# Patient Record
Sex: Male | Born: 1970 | Race: White | Hispanic: No | Marital: Single | Smoking: Former smoker
Health system: Southern US, Community
[De-identification: ages and names within clinical notes are randomized; demographics above are authoritative.]

## PROBLEM LIST (undated history)

## (undated) DIAGNOSIS — F10929 Alcohol use, unspecified with intoxication, unspecified: Secondary | ICD-10-CM

## (undated) DIAGNOSIS — E538 Deficiency of other specified B group vitamins: Secondary | ICD-10-CM

## (undated) DIAGNOSIS — I10 Essential (primary) hypertension: Secondary | ICD-10-CM

## (undated) DIAGNOSIS — F1011 Alcohol abuse, in remission: Secondary | ICD-10-CM

## (undated) DIAGNOSIS — F419 Anxiety disorder, unspecified: Secondary | ICD-10-CM

## (undated) DIAGNOSIS — N200 Calculus of kidney: Secondary | ICD-10-CM

## (undated) DIAGNOSIS — R278 Other lack of coordination: Secondary | ICD-10-CM

## (undated) DIAGNOSIS — J45909 Unspecified asthma, uncomplicated: Secondary | ICD-10-CM

## (undated) DIAGNOSIS — N289 Disorder of kidney and ureter, unspecified: Secondary | ICD-10-CM

## (undated) DIAGNOSIS — G4700 Insomnia, unspecified: Secondary | ICD-10-CM

## (undated) HISTORY — PX: APPENDECTOMY: SHX54

## (undated) HISTORY — PX: ABDOMINAL SURGERY: SHX537

## (undated) HISTORY — PX: HERNIA REPAIR: SHX51

---

## 2019-12-08 DIAGNOSIS — F10231 Alcohol dependence with withdrawal delirium: Secondary | ICD-10-CM

## 2019-12-08 DIAGNOSIS — K7010 Alcoholic hepatitis without ascites: Secondary | ICD-10-CM | POA: Insufficient documentation

## 2019-12-08 DIAGNOSIS — R569 Unspecified convulsions: Secondary | ICD-10-CM

## 2019-12-13 DIAGNOSIS — I639 Cerebral infarction, unspecified: Secondary | ICD-10-CM | POA: Insufficient documentation

## 2020-11-20 ENCOUNTER — Other Ambulatory Visit: Payer: Self-pay

## 2020-11-20 ENCOUNTER — Emergency Department: Payer: 59

## 2020-11-20 DIAGNOSIS — I10 Essential (primary) hypertension: Secondary | ICD-10-CM | POA: Diagnosis not present

## 2020-11-20 DIAGNOSIS — K2921 Alcoholic gastritis with bleeding: Secondary | ICD-10-CM | POA: Insufficient documentation

## 2020-11-20 DIAGNOSIS — F10239 Alcohol dependence with withdrawal, unspecified: Secondary | ICD-10-CM | POA: Insufficient documentation

## 2020-11-20 DIAGNOSIS — R1084 Generalized abdominal pain: Secondary | ICD-10-CM | POA: Diagnosis present

## 2020-11-20 DIAGNOSIS — F17210 Nicotine dependence, cigarettes, uncomplicated: Secondary | ICD-10-CM | POA: Insufficient documentation

## 2020-11-20 DIAGNOSIS — J45909 Unspecified asthma, uncomplicated: Secondary | ICD-10-CM | POA: Diagnosis not present

## 2020-11-20 LAB — URINALYSIS, COMPLETE (UACMP) WITH MICROSCOPIC
Bacteria, UA: NONE SEEN
Bilirubin Urine: NEGATIVE
Glucose, UA: NEGATIVE mg/dL
Ketones, ur: NEGATIVE mg/dL
Leukocytes,Ua: NEGATIVE
Nitrite: NEGATIVE
Protein, ur: 100 mg/dL — AB
Specific Gravity, Urine: 1.011 (ref 1.005–1.030)
Squamous Epithelial / HPF: NONE SEEN (ref 0–5)
pH: 6 (ref 5.0–8.0)

## 2020-11-20 LAB — CBC
HCT: 46.5 % (ref 39.0–52.0)
Hemoglobin: 15.7 g/dL (ref 13.0–17.0)
MCH: 31.5 pg (ref 26.0–34.0)
MCHC: 33.8 g/dL (ref 30.0–36.0)
MCV: 93.4 fL (ref 80.0–100.0)
Platelets: 165 10*3/uL (ref 150–400)
RBC: 4.98 MIL/uL (ref 4.22–5.81)
RDW: 14.4 % (ref 11.5–15.5)
WBC: 5.8 10*3/uL (ref 4.0–10.5)
nRBC: 0 % (ref 0.0–0.2)

## 2020-11-20 LAB — COMPREHENSIVE METABOLIC PANEL
ALT: 36 U/L (ref 0–44)
AST: 39 U/L (ref 15–41)
Albumin: 4.3 g/dL (ref 3.5–5.0)
Alkaline Phosphatase: 63 U/L (ref 38–126)
Anion gap: 15 (ref 5–15)
BUN: 10 mg/dL (ref 6–20)
CO2: 23 mmol/L (ref 22–32)
Calcium: 8.5 mg/dL — ABNORMAL LOW (ref 8.9–10.3)
Chloride: 103 mmol/L (ref 98–111)
Creatinine, Ser: 0.82 mg/dL (ref 0.61–1.24)
GFR, Estimated: >60 mL/min (ref >60)
Glucose, Bld: 98 mg/dL (ref 70–99)
Potassium: 3.6 mmol/L (ref 3.5–5.1)
Sodium: 141 mmol/L (ref 135–145)
Total Bilirubin: 0.7 mg/dL (ref 0.3–1.2)
Total Protein: 7.5 g/dL (ref 6.5–8.1)

## 2020-11-20 LAB — LIPASE, BLOOD: Lipase: 25 U/L (ref 11–51)

## 2020-11-20 MED ORDER — SODIUM CHLORIDE 0.9 % IV BOLUS
1000.0000 mL | Freq: Once | INTRAVENOUS | Status: AC
  Administered 2020-11-20: 1000 mL via INTRAVENOUS

## 2020-11-20 MED ORDER — IOHEXOL 300 MG/ML  SOLN
100.0000 mL | Freq: Once | INTRAMUSCULAR | Status: AC | PRN
  Administered 2020-11-20: 100 mL via INTRAVENOUS

## 2020-11-20 MED ORDER — ONDANSETRON HCL 4 MG/2ML IJ SOLN: 4.0000 mg | Freq: Once | INTRAMUSCULAR | Status: DC

## 2020-11-20 MED ORDER — ONDANSETRON HCL 4 MG/2ML IJ SOLN
4.0000 mg | Freq: Once | INTRAMUSCULAR | Status: AC | PRN
  Administered 2020-11-20: 4 mg via INTRAVENOUS
  Filled 2020-11-20: qty 2

## 2020-11-20 MED ORDER — FENTANYL CITRATE (PF) 100 MCG/2ML IJ SOLN
50.0000 ug | Freq: Once | INTRAMUSCULAR | Status: AC
  Administered 2020-11-20: 50 ug via INTRAVENOUS
  Filled 2020-11-20: qty 2

## 2020-11-20 NOTE — ED Triage Notes (Signed)
First Nurse: patient brought in by ems from a hotel. Patient with complaint of near syncope, abdominal pain and vomiting. Patient was given 4 mg IV Zofran by ems. Patient reported to ems that he has been sober for awhile but drank 3 beers today. Ems vital signs bp 190/120 and hr 108.

## 2020-11-20 NOTE — ED Triage Notes (Signed)
L sided abd pain onset today. Hx of bowel obstruction and states pain feels the same. Hx of open appendectomy and diaphragm repair as well. Presents with n/v onset today as well. Reports last bm 2-3 days ago and reports normal in nature. Reports decreased oral intake due to n/v. Reports "a couple" alcoholic beverages today but denies hx of abuse or pancreatitis.

## 2020-11-21 ENCOUNTER — Other Ambulatory Visit: Payer: Self-pay

## 2020-11-21 ENCOUNTER — Emergency Department
Admission: EM | Admit: 2020-11-21 | Discharge: 2020-11-21 | Disposition: A | Discharge status: Home / Self Care | Payer: 59 | Attending: Emergency Medicine | Admitting: Emergency Medicine

## 2020-11-21 ENCOUNTER — Encounter: Payer: Self-pay | Admitting: Emergency Medicine

## 2020-11-21 DIAGNOSIS — F10230 Alcohol dependence with withdrawal, uncomplicated: Secondary | ICD-10-CM

## 2020-11-21 DIAGNOSIS — K2920 Alcoholic gastritis without bleeding: Secondary | ICD-10-CM

## 2020-11-21 DIAGNOSIS — F10930 Alcohol use, unspecified with withdrawal, uncomplicated: Secondary | ICD-10-CM

## 2020-11-21 HISTORY — DX: Alcohol abuse, in remission: F10.11

## 2020-11-21 HISTORY — DX: Unspecified asthma, uncomplicated: J45.909

## 2020-11-21 HISTORY — DX: Essential (primary) hypertension: I10

## 2020-11-21 MED ORDER — LORAZEPAM 1 MG PO TABS: 1.0000 mg | ORAL_TABLET | Freq: Once | ORAL | Status: DC

## 2020-11-21 MED ORDER — CHLORDIAZEPOXIDE HCL 5 MG PO CAPS: ORAL_CAPSULE | ORAL | 0 refills | Status: DC

## 2020-11-21 MED ORDER — ONDANSETRON 4 MG PO TBDP: ORAL_TABLET | ORAL | 0 refills | Status: DC

## 2020-11-21 MED ORDER — LORAZEPAM 2 MG PO TABS
2.0000 mg | ORAL_TABLET | Freq: Once | ORAL | Status: AC
  Administered 2020-11-21: 2 mg via ORAL
  Filled 2020-11-21: qty 1

## 2020-11-21 MED ORDER — LORAZEPAM 2 MG/ML IJ SOLN: 2.0000 mg | Freq: Once | INTRAMUSCULAR | Status: DC

## 2020-11-21 MED ORDER — LACTATED RINGERS IV BOLUS
1000.0000 mL | Freq: Once | INTRAVENOUS | Status: AC
  Administered 2020-11-21: 1000 mL via INTRAVENOUS

## 2020-11-21 MED ORDER — LORAZEPAM 2 MG/ML IJ SOLN
1.0000 mg | Freq: Once | INTRAMUSCULAR | Status: AC
  Administered 2020-11-21: 1 mg via INTRAVENOUS
  Filled 2020-11-21: qty 1

## 2020-11-21 NOTE — ED Notes (Signed)
Pt woke from sleep in subwait, after waking began to shake arms, pt then ambulatory to room 16

## 2020-11-21 NOTE — ED Provider Notes (Signed)
[facility] Emergency Department Provider Note  ____________________________________________   Event Date/Time   First MD Initiated Contact with Patient 11/21/20 0050     (approximate)  I have reviewed the triage vital signs and the nursing notes.   HISTORY  Chief Complaint Abdominal Pain    HPI Isaac Jackson is a 50 y.o. male reports medical history as listed below  which notably includes a history of alcoholism but he says he has never had any complications from alcoholism such as pancreatitis.  He reports that he has had a history of bowel obstruction in the past.  He presents tonight for not feeling well for about 3 days including general malaise, generalized abdominal pain worsening of the top of his abdomen, nausea, and vomiting.  He has had decreased oral intake.  He said the symptoms started shortly after having a few drinks about 4 days ago.  He denies having any alcohol since then.  His symptoms are severe nothing particular makes them better or worse.  He has had kidney stones in the past but says this does not feel like kidney stones.  He has had no dysuria nor hematuria.  No pain in his flank or back.  He denies fever/chills, sore throat, chest pain, shortness of breath.        Past Medical History:  Diagnosis Date  . Asthma   . History of alcohol abuse    patient reports that he is an alcoholic and goes to AA  . Hypertension     There are no problems to display for this patient.   Past Surgical History:  Procedure Laterality Date  . ABDOMINAL SURGERY    . APPENDECTOMY      Prior to Admission medications   Medication Sig Start Date End Date Taking? Authorizing Provider  chlordiazePOXIDE (LIBRIUM) 5 MG capsule Take 6 caps (30 mg) 4x daily on day 1. Take 5 caps (25 mg) 4x daily on day 2. Take 4 caps (20 mg) 4x daily on day 3. Take 3 caps (15 mg) 4x daily on day 4. Take 2 caps (10 mg) 4x daily on day 5. Take 2 caps (10 mg) 3x daily on  day 6. Take 1 cap (5 mg) 3x daily on day 7. Take 1 cap (5 mg) twice daily on day 8. Take 1 cap (5 mg) in the evening on day 9. 11/21/20  Yes Loleta Rose, MD  ondansetron (ZOFRAN ODT) 4 MG disintegrating tablet Allow 1-2 tablets to dissolve in your mouth every 8 hours as needed for nausea/vomiting 11/21/20  Yes Loleta Rose, MD    Allergies Patient has no allergy information on record.  History reviewed. No pertinent family history.  Social History Social History   Tobacco Use  . Smoking status: Current Some Day Smoker    Types: Cigarettes  . Smokeless tobacco: Never Used  Vaping Use  . Vaping Use: Never used  Substance Use Topics  . Alcohol use: Yes    Comment: daily  . Drug use: Never    Review of Systems Constitutional: No fever/chills Eyes: No visual changes. ENT: No sore throat. Cardiovascular: Denies chest pain. Respiratory: Denies shortness of breath. Gastrointestinal: Generalized abdominal pain with nausea and vomiting. Genitourinary: Negative for dysuria.  Denies hematuria. Musculoskeletal: Negative for neck pain.  Negative for back pain. Integumentary: Negative for rash. Neurological: Negative for headaches, focal weakness or numbness.   ____________________________________________   PHYSICAL EXAM:  VITAL SIGNS: ED Triage Vitals  Enc Vitals Group  BP 11/20/20 2020 (S) (!) 166/119     Pulse Rate 11/20/20 2020 (!) 106     Resp 11/20/20 2020 (!) 22     Temp 11/20/20 2203 98 F (36.7 C)     Temp Source 11/20/20 2020 Oral     SpO2 11/20/20 2020 97 %     Weight 11/20/20 2021 63.5 kg (140 lb)     Height 11/20/20 2021 1.702 m (5\' 7" )     Head Circumference --      Peak Flow --      Pain Score 11/20/20 2021 6     Pain Loc --      Pain Edu? --      Excl. in GC? --     Constitutional: Alert and oriented.  Appears uncomfortable but nontoxic. Eyes: Conjunctivae are normal.  Head: Atraumatic. Nose: No congestion/rhinnorhea. Mouth/Throat: Patient is  wearing a mask. Neck: No stridor.  No meningeal signs.   Cardiovascular: Mild tachycardia, regular rhythm. Good peripheral circulation. Respiratory: Normal respiratory effort.  No retractions. Gastrointestinal: Soft and nondistended.  Mild generalized tenderness to palpation, better with distraction. Musculoskeletal: No lower extremity tenderness nor edema. No gross deformities of extremities. Neurologic:  Normal speech and language. No gross focal neurologic deficits are appreciated.  Skin:  Skin is warm, dry and intact. Psychiatric: Mood and affect are normal. Speech and behavior are normal.  ____________________________________________   LABS (all labs ordered are listed, but only abnormal results are displayed)  Labs Reviewed  COMPREHENSIVE METABOLIC PANEL - Abnormal; Notable for the following components:      Result Value   Calcium 8.5 (*)    All other components within normal limits  URINALYSIS, COMPLETE (UACMP) WITH MICROSCOPIC - Abnormal; Notable for the following components:   Color, Urine YELLOW (*)    APPearance CLEAR (*)    Hgb urine dipstick SMALL (*)    Protein, ur 100 (*)    All other components within normal limits  LIPASE, BLOOD  CBC  ETHANOL   ____________________________________________  EKG  ED ECG REPORT I, Loleta Rose, the attending physician, personally viewed and interpreted this ECG.  Date: 11/21/2020 EKG Time: 1:55 AM Rate: 102 Rhythm: Sinus tachycardia QRS Axis: normal Intervals: Incomplete right bundle branch block ST/T Wave abnormalities: Non-specific ST segment / T-wave changes, but no clear evidence of acute ischemia. Narrative Interpretation: no definitive evidence of acute ischemia; does not meet STEMI criteria.   ____________________________________________  RADIOLOGY I, Loleta Rose, personally viewed and evaluated these images (plain radiographs) as part of my medical decision making, as well as reviewing the written report by the  radiologist.  ED MD interpretation: Fatty liver disease with bilateral nonobstructing renal calculi but no evidence of an acute or emergent condition.  Official radiology report(s): CT ABDOMEN PELVIS W CONTRAST  Result Date: 11/20/2020 CLINICAL DATA:  Nausea and vomiting with abdominal pain EXAM: CT ABDOMEN AND PELVIS WITH CONTRAST TECHNIQUE: Multidetector CT imaging of the abdomen and pelvis was performed using the standard protocol following bolus administration of intravenous contrast. CONTRAST:  OMNIPAQUE IOHEXOL 300 MG/ML  SOLN COMPARISON:  None. FINDINGS: Lower chest: No acute abnormality. Hepatobiliary: Fatty infiltration of the liver is noted. The gallbladder is within normal limits. Pancreas: Pancreas is atrophic. Spleen: Normal in size without focal abnormality. Adrenals/Urinary Tract: Adrenal glands are within normal limits. Kidneys demonstrate a normal enhancement pattern bilaterally. Bilateral nonobstructing renal calculi are noted. The largest of these in the left kidney is in the upper pole measuring 9 to  10 mm in greatest dimension. The largest of these on the right measures approximately 6 mm. Ureters are within normal limits. No obstructive changes are seen. The bladder is partially distended. Stomach/Bowel: The appendix is not visualized consistent with a prior surgical history. No obstructive or inflammatory changes of the colon are seen. The small bowel and stomach are within normal limits with the exception of changes of mild hiatal hernia and apparent fundoplication. Vascular/Lymphatic: No significant vascular findings are present. No enlarged abdominal or pelvic lymph nodes. Reproductive: Prostate is unremarkable. Other: No abdominal wall hernia or abnormality. No abdominopelvic ascites. Postsurgical changes are noted consistent with prior hernia repair bilaterally. Musculoskeletal: Degenerative changes of the lumbar spine are noted. IMPRESSION: Fatty liver. Bilateral  nonobstructing renal calculi. Small hiatal hernia and changes of prior fundoplication. Electronically Signed   By: Alcide Clever M.D.   On: 11/20/2020 22:03    ____________________________________________   PROCEDURES   Procedure(s) performed (including Critical Care):  .1-3 Lead EKG Interpretation Performed by: Loleta Rose, MD Authorized by: Loleta Rose, MD     Interpretation: abnormal     ECG rate:  102   ECG rate assessment: tachycardic     Rhythm: sinus tachycardia     Ectopy: none     Conduction: normal       ____________________________________________   INITIAL IMPRESSION / MDM / ASSESSMENT AND PLAN / ED COURSE  As part of my medical decision making, I reviewed the following data within the electronic MEDICAL RECORD NUMBER Nursing notes reviewed and incorporated, Labs reviewed , EKG interpreted , Old chart reviewed, Notes from prior ED visits and [place] Controlled Substance Database   Differential diagnosis includes, but is not limited to, alcoholic gastritis, pancreatitis, SBO/ileus, ureteral colic.  The patient is on the cardiac monitor to evaluate for evidence of arrhythmia and/or significant heart rate changes.  Vital signs are notable for hypertension and tachycardia but I suspect that is related to his current symptoms.  Lab work is generally reassuring with a normal comprehensive metabolic panel, normal CBC, and a small amount of hematuria which could be related to the kidney stones but I suspect that those are not the cause of his current pain since they are intrarenal and not in the ureter.  I suspect he is either having some alcohol withdrawal or has acute alcoholic gastritis or possibly degree of chronic pancreatitis although he denies ever having had pancreatitis in the past.  I cannot find anything on him in the computer system but registration will update his information.  However at this point there is no evidence of an emergent medical condition that would  require him to be hospitalized.  He already received 1 L of crystalloid and I ordered another liter of lactated Ringer's.  We are going to get an EKG so I can verify his intervals and then I will give him a dose of droperidol or possibly Ativan given the concern for withdrawal.  Anticipate discharge and outpatient follow-up.  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  He has access to additional medical care and follow-up if he needs it.  Clinical Course as of 11/21/20 7510  Wynelle Link Nov 21, 2020  2585 Patient feels much better after Ativan.  His heart rate has come down below 100.  He is no longer nauseous or vomiting.  We talked little bit more about the amount  of alcohol he has been drinking and he admitted that he has been drinking more than he initially indicated.  I believe that he has accommodation of alcoholic gastritis and alcohol withdrawal.  I am giving him 1 mg of Ativan by mouth before discharge and he agrees that discharge is appropriate.  I am going to give him the prescription for Librium and I stressed that he should not drink with the Librium.  I gave him information about outpatient resources for alcohol abuse.  I gave my usual and customary return precautions. [CF]  0321 Now that his demographics are complete, I also see that he has had numerous prescriptions for controlled substances including pain medication and Librium over the course of the last 2 years. [CF]  [phone] Of note, even though he does seem to suffer from an alcohol problem, I have no reason to believe that he is working well impaired or that he presents a danger to his patients. 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 However, any reporting of his condition would be a violation of his privacy and I have no right or means by which to do that given that he does  not meet any criteria for involuntary commitment, inpatient medical treatment (no DTs, no seizures), and I have no evidence of him endangering anyone or acting in an irresponsible manner while at work. [CF]    Clinical Course User Index [CF] Loleta Rose, MD     ____________________________________________  FINAL CLINICAL IMPRESSION(S) / ED DIAGNOSES  Final diagnoses:  Acute alcoholic gastritis, presence of bleeding unspecified  Alcohol withdrawal syndrome without complication (HCC)     MEDICATIONS GIVEN DURING THIS VISIT:  Medications  LORazepam (ATIVAN) tablet 2 mg (has no administration in time range)  ondansetron (ZOFRAN) injection 4 mg (4 mg Intravenous Given 11/20/20 2032)  fentaNYL (SUBLIMAZE) injection 50 mcg (50 mcg Intravenous Given 11/20/20 2123)  sodium chloride 0.9 % bolus 1,000 mL (0 mLs Intravenous Stopped 11/20/20 2244)  iohexol (OMNIPAQUE) 300 MG/ML solution 100 mL (100 mLs Intravenous Contrast Given 11/20/20 2148)  lactated ringers bolus 1,000 mL (1,000 mLs Intravenous New Bag/Given 11/21/20 0157)  LORazepam (ATIVAN) injection 1 mg (1 mg Intravenous Given 11/21/20 0158)     ED Discharge Orders         Ordered    chlordiazePOXIDE (LIBRIUM) 5 MG capsule        11/21/20 0322    ondansetron (ZOFRAN ODT) 4 MG disintegrating tablet        11/21/20 3244          *Please note:  Isaac Jackson was evaluated in Emergency Department on 11/21/2020 for the symptoms described in the history of present illness. He was evaluated in the context of the global COVID-19 pandemic, which necessitated consideration that the patient might be at risk for infection with the SARS-CoV-2 virus that causes COVID-19. Institutional protocols and algorithms that pertain to the evaluation of patients at risk for COVID-19 are in a state of rapid change based on information released by regulatory bodies including the CDC and federal and state organizations. These policies and algorithms were  followed during the patient's care in the ED.  Some ED evaluations and interventions may be delayed as a result of limited staffing during and after the pandemic.*  Note:  This document was prepared using Dragon voice recognition software and may include unintentional dictation errors.   Loleta Rose, MD  11/21/20 0338  

## 2020-11-21 NOTE — ED Notes (Signed)
Peripheral IV discontinued. Catheter intact. No signs of infiltration or redness. Gauze applied to IV site.   Discharge instructions reviewed with patient. Questions fielded by this RN. Patient verbalizes understanding of instructions. Patient discharged home in stable condition per forbach. No acute distress noted at time of discharge.   Pt ambulatory to lobby with resources to get ride home

## 2020-11-21 NOTE — ED Notes (Signed)
ED Provider at bedside.  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

## 2020-11-21 NOTE — Discharge Instructions (Signed)
As we discussed, we believe you are having a combination of alcoholic gastritis which is making said your stomach but also alcohol withdrawal.  Please avoid alcohol and use the prescription provided for Librium as written on the label instructions.  Consider following up with 1 or more of the outpatient resource resources presented in the guide.  Return to the emergency department with new or worsening symptoms that concern you.

## 2021-02-21 ENCOUNTER — Other Ambulatory Visit: Payer: Self-pay

## 2021-02-21 ENCOUNTER — Emergency Department: Payer: 59

## 2021-02-21 ENCOUNTER — Inpatient Hospital Stay
Admission: EM | Admit: 2021-02-21 | Discharge: 2021-02-23 | DRG: 694 | Disposition: A | Discharge status: Home / Self Care | Payer: 59 | Attending: Internal Medicine | Admitting: Internal Medicine

## 2021-02-21 ENCOUNTER — Encounter: Payer: Self-pay | Admitting: Emergency Medicine

## 2021-02-21 DIAGNOSIS — Z5320 Procedure and treatment not carried out because of patient's decision for unspecified reasons: Secondary | ICD-10-CM | POA: Diagnosis not present

## 2021-02-21 DIAGNOSIS — R10A0 Flank pain, unspecified side: Secondary | ICD-10-CM

## 2021-02-21 DIAGNOSIS — R17 Unspecified jaundice: Secondary | ICD-10-CM | POA: Diagnosis present

## 2021-02-21 DIAGNOSIS — R52 Pain, unspecified: Secondary | ICD-10-CM

## 2021-02-21 DIAGNOSIS — I10 Essential (primary) hypertension: Secondary | ICD-10-CM | POA: Diagnosis present

## 2021-02-21 DIAGNOSIS — K760 Fatty (change of) liver, not elsewhere classified: Secondary | ICD-10-CM | POA: Diagnosis present

## 2021-02-21 DIAGNOSIS — R251 Tremor, unspecified: Secondary | ICD-10-CM | POA: Diagnosis not present

## 2021-02-21 DIAGNOSIS — R109 Unspecified abdominal pain: Secondary | ICD-10-CM

## 2021-02-21 DIAGNOSIS — K5900 Constipation, unspecified: Secondary | ICD-10-CM | POA: Diagnosis present

## 2021-02-21 DIAGNOSIS — N179 Acute kidney failure, unspecified: Secondary | ICD-10-CM

## 2021-02-21 DIAGNOSIS — E860 Dehydration: Secondary | ICD-10-CM | POA: Diagnosis present

## 2021-02-21 DIAGNOSIS — F17210 Nicotine dependence, cigarettes, uncomplicated: Secondary | ICD-10-CM | POA: Diagnosis present

## 2021-02-21 DIAGNOSIS — E871 Hypo-osmolality and hyponatremia: Secondary | ICD-10-CM | POA: Diagnosis present

## 2021-02-21 DIAGNOSIS — R079 Chest pain, unspecified: Secondary | ICD-10-CM | POA: Diagnosis present

## 2021-02-21 DIAGNOSIS — Z20822 Contact with and (suspected) exposure to covid-19: Secondary | ICD-10-CM | POA: Diagnosis present

## 2021-02-21 DIAGNOSIS — E876 Hypokalemia: Secondary | ICD-10-CM | POA: Diagnosis present

## 2021-02-21 DIAGNOSIS — Z87442 Personal history of urinary calculi: Secondary | ICD-10-CM

## 2021-02-21 DIAGNOSIS — R0789 Other chest pain: Secondary | ICD-10-CM | POA: Diagnosis present

## 2021-02-21 DIAGNOSIS — F419 Anxiety disorder, unspecified: Secondary | ICD-10-CM | POA: Diagnosis present

## 2021-02-21 DIAGNOSIS — N400 Enlarged prostate without lower urinary tract symptoms: Secondary | ICD-10-CM | POA: Diagnosis present

## 2021-02-21 DIAGNOSIS — R112 Nausea with vomiting, unspecified: Secondary | ICD-10-CM | POA: Diagnosis present

## 2021-02-21 DIAGNOSIS — Z7141 Alcohol abuse counseling and surveillance of alcoholic: Secondary | ICD-10-CM

## 2021-02-21 DIAGNOSIS — E861 Hypovolemia: Secondary | ICD-10-CM | POA: Diagnosis present

## 2021-02-21 DIAGNOSIS — F1010 Alcohol abuse, uncomplicated: Secondary | ICD-10-CM | POA: Diagnosis present

## 2021-02-21 DIAGNOSIS — N200 Calculus of kidney: Principal | ICD-10-CM

## 2021-02-21 HISTORY — DX: Calculus of kidney: N20.0

## 2021-02-21 HISTORY — DX: Disorder of kidney and ureter, unspecified: N28.9

## 2021-02-21 LAB — URINALYSIS, COMPLETE (UACMP) WITH MICROSCOPIC
Bilirubin Urine: NEGATIVE
Glucose, UA: NEGATIVE mg/dL
Ketones, ur: NEGATIVE mg/dL
Leukocytes,Ua: NEGATIVE
Nitrite: NEGATIVE
Protein, ur: NEGATIVE mg/dL
Specific Gravity, Urine: 1.005 (ref 1.005–1.030)
Squamous Epithelial / HPF: NONE SEEN (ref 0–5)
pH: 7 (ref 5.0–8.0)

## 2021-02-21 LAB — COMPREHENSIVE METABOLIC PANEL
ALT: 41 U/L (ref 0–44)
AST: 40 U/L (ref 15–41)
Albumin: 4.4 g/dL (ref 3.5–5.0)
Alkaline Phosphatase: 56 U/L (ref 38–126)
Anion gap: 16 — ABNORMAL HIGH (ref 5–15)
BUN: 23 mg/dL — ABNORMAL HIGH (ref 6–20)
CO2: 23 mmol/L (ref 22–32)
Calcium: 9.1 mg/dL (ref 8.9–10.3)
Chloride: 93 mmol/L — ABNORMAL LOW (ref 98–111)
Creatinine, Ser: 1.47 mg/dL — ABNORMAL HIGH (ref 0.61–1.24)
GFR, Estimated: 58 mL/min — ABNORMAL LOW (ref >60)
Glucose, Bld: 108 mg/dL — ABNORMAL HIGH (ref 70–99)
Potassium: 3.1 mmol/L — ABNORMAL LOW (ref 3.5–5.1)
Sodium: 132 mmol/L — ABNORMAL LOW (ref 135–145)
Total Bilirubin: 1.6 mg/dL — ABNORMAL HIGH (ref 0.3–1.2)
Total Protein: 7.9 g/dL (ref 6.5–8.1)

## 2021-02-21 LAB — TROPONIN I (HIGH SENSITIVITY): Troponin I (High Sensitivity): 9 ng/L (ref <18)

## 2021-02-21 LAB — CBC
HCT: 48 % (ref 39.0–52.0)
Hemoglobin: 17 g/dL (ref 13.0–17.0)
MCH: 31.7 pg (ref 26.0–34.0)
MCHC: 35.4 g/dL (ref 30.0–36.0)
MCV: 89.6 fL (ref 80.0–100.0)
Platelets: 223 10*3/uL (ref 150–400)
RBC: 5.36 MIL/uL (ref 4.22–5.81)
RDW: 13.9 % (ref 11.5–15.5)
WBC: 9.8 10*3/uL (ref 4.0–10.5)
nRBC: 0 % (ref 0.0–0.2)

## 2021-02-21 LAB — LIPASE, BLOOD: Lipase: 36 U/L (ref 11–51)

## 2021-02-21 MED ORDER — NALOXONE HCL 0.4 MG/ML IJ SOLN: 0.4000 mg | INTRAMUSCULAR | Status: DC | PRN

## 2021-02-21 MED ORDER — ADULT MULTIVITAMIN W/MINERALS CH
1.0000 | ORAL_TABLET | Freq: Every day | ORAL | Status: DC
  Administered 2021-02-21 – 2021-02-22 (×2): 1 via ORAL
  Filled 2021-02-21 (×2): qty 1

## 2021-02-21 MED ORDER — POTASSIUM CHLORIDE 2 MEQ/ML IV SOLN
INTRAVENOUS | Status: DC
  Filled 2021-02-21 (×8): qty 1000

## 2021-02-21 MED ORDER — LORAZEPAM 1 MG PO TABS: 1.0000 mg | ORAL_TABLET | ORAL | Status: DC | PRN

## 2021-02-21 MED ORDER — ACETAMINOPHEN 325 MG PO TABS: 650.0000 mg | ORAL_TABLET | Freq: Four times a day (QID) | ORAL | Status: DC | PRN

## 2021-02-21 MED ORDER — LORAZEPAM 2 MG/ML IJ SOLN
0.5000 mg | Freq: Four times a day (QID) | INTRAMUSCULAR | Status: DC | PRN
  Administered 2021-02-21: 0.5 mg via INTRAVENOUS
  Filled 2021-02-21: qty 1

## 2021-02-21 MED ORDER — ALPRAZOLAM 0.5 MG PO TABS
1.0000 mg | ORAL_TABLET | Freq: Every day | ORAL | Status: DC | PRN
  Administered 2021-02-21: 1 mg via ORAL
  Filled 2021-02-21: qty 2

## 2021-02-21 MED ORDER — LORAZEPAM 2 MG/ML IJ SOLN: 0.5000 mg | Freq: Once | INTRAMUSCULAR | Status: DC

## 2021-02-21 MED ORDER — POLYETHYLENE GLYCOL 3350 17 G PO PACK
17.0000 g | PACK | Freq: Every day | ORAL | Status: DC
  Administered 2021-02-21 – 2021-02-22 (×2): 17 g via ORAL
  Filled 2021-02-21 (×2): qty 1

## 2021-02-21 MED ORDER — FOLIC ACID 1 MG PO TABS
1.0000 mg | ORAL_TABLET | Freq: Every day | ORAL | Status: DC
  Administered 2021-02-21 – 2021-02-22 (×2): 1 mg via ORAL
  Filled 2021-02-21 (×2): qty 1

## 2021-02-21 MED ORDER — ONDANSETRON HCL 4 MG/2ML IJ SOLN
4.0000 mg | Freq: Four times a day (QID) | INTRAMUSCULAR | Status: DC | PRN
  Administered 2021-02-21 – 2021-02-22 (×3): 4 mg via INTRAVENOUS
  Filled 2021-02-21 (×3): qty 2

## 2021-02-21 MED ORDER — HYDROMORPHONE HCL 1 MG/ML IJ SOLN
1.0000 mg | INTRAMUSCULAR | Status: DC | PRN
Start: 2021-02-21 — End: 2021-02-23
  Administered 2021-02-21 – 2021-02-22 (×3): 1 mg via INTRAVENOUS
  Filled 2021-02-21 (×3): qty 1

## 2021-02-21 MED ORDER — OXYCODONE HCL 5 MG PO TABS
10.0000 mg | ORAL_TABLET | ORAL | Status: DC | PRN
  Administered 2021-02-21 – 2021-02-23 (×5): 10 mg via ORAL
  Filled 2021-02-21 (×5): qty 2

## 2021-02-21 MED ORDER — ONDANSETRON HCL 4 MG/2ML IJ SOLN
4.0000 mg | Freq: Once | INTRAMUSCULAR | Status: AC
  Administered 2021-02-21: 4 mg via INTRAVENOUS
  Filled 2021-02-21: qty 2

## 2021-02-21 MED ORDER — KETOROLAC TROMETHAMINE 30 MG/ML IJ SOLN
30.0000 mg | Freq: Once | INTRAMUSCULAR | Status: AC
  Administered 2021-02-21: 30 mg via INTRAVENOUS
  Filled 2021-02-21: qty 1

## 2021-02-21 MED ORDER — TAMSULOSIN HCL 0.4 MG PO CAPS
0.4000 mg | ORAL_CAPSULE | Freq: Every day | ORAL | Status: DC
  Administered 2021-02-21 – 2021-02-23 (×3): 0.4 mg via ORAL
  Filled 2021-02-21 (×3): qty 1

## 2021-02-21 MED ORDER — THIAMINE HCL 100 MG PO TABS
100.0000 mg | ORAL_TABLET | Freq: Every day | ORAL | Status: DC
  Administered 2021-02-21 – 2021-02-22 (×2): 100 mg via ORAL
  Filled 2021-02-21 (×2): qty 1

## 2021-02-21 MED ORDER — THIAMINE HCL 100 MG/ML IJ SOLN
100.0000 mg | Freq: Every day | INTRAMUSCULAR | Status: DC
  Administered 2021-02-21: 100 mg via INTRAVENOUS
  Filled 2021-02-21: qty 2

## 2021-02-21 MED ORDER — LORAZEPAM 2 MG/ML IJ SOLN: 1.0000 mg | INTRAMUSCULAR | Status: DC | PRN

## 2021-02-21 MED ORDER — MORPHINE SULFATE (PF) 4 MG/ML IV SOLN
4.0000 mg | Freq: Once | INTRAVENOUS | Status: AC
Start: 2021-02-21 — End: 2021-02-21
  Administered 2021-02-21: 4 mg via INTRAVENOUS
  Filled 2021-02-21: qty 1

## 2021-02-21 MED ORDER — HYDROXYZINE PAMOATE 25 MG PO CAPS
25.0000 mg | ORAL_CAPSULE | Freq: Three times a day (TID) | ORAL | Status: DC | PRN
  Filled 2021-02-21 (×2): qty 1

## 2021-02-21 MED ORDER — KCL-LACTATED RINGERS 20 MEQ/L IV SOLN: INTRAVENOUS | Status: DC

## 2021-02-21 MED ORDER — MORPHINE SULFATE (PF) 4 MG/ML IV SOLN
4.0000 mg | Freq: Once | INTRAVENOUS | Status: AC
  Administered 2021-02-21: 4 mg via INTRAVENOUS
  Filled 2021-02-21: qty 1

## 2021-02-21 MED ORDER — HYDROMORPHONE HCL 1 MG/ML IJ SOLN
1.0000 mg | Freq: Once | INTRAMUSCULAR | Status: AC
Start: 2021-02-21 — End: 2021-02-21
  Administered 2021-02-21: 1 mg via INTRAVENOUS
  Filled 2021-02-21: qty 1

## 2021-02-21 MED ORDER — SODIUM CHLORIDE 0.9 % IV SOLN
1000.0000 mL | Freq: Once | INTRAVENOUS | Status: AC
  Administered 2021-02-21: 1000 mL via INTRAVENOUS

## 2021-02-21 MED ORDER — HYDROMORPHONE HCL 1 MG/ML IJ SOLN: 1.0000 mg | INTRAMUSCULAR | Status: DC | PRN

## 2021-02-21 MED ORDER — OXYCODONE HCL 5 MG PO TABS: 5.0000 mg | ORAL_TABLET | ORAL | Status: DC | PRN

## 2021-02-21 MED ORDER — ONDANSETRON HCL 4 MG/2ML IJ SOLN: 4.0000 mg | Freq: Once | INTRAMUSCULAR | Status: DC

## 2021-02-21 MED ORDER — SENNOSIDES-DOCUSATE SODIUM 8.6-50 MG PO TABS
2.0000 | ORAL_TABLET | Freq: Two times a day (BID) | ORAL | Status: DC
  Administered 2021-02-21 – 2021-02-23 (×3): 2 via ORAL
  Filled 2021-02-21 (×3): qty 2

## 2021-02-21 NOTE — ED Triage Notes (Signed)
Says abd pain left lower quad and groin for few days.  Also has some dull chest pain that started yesterday. Last bm several days.  Has hisory of bowel obstruction/hernias with repair.  He is alert and oriented.  Appears in distress. Skin warm and dry.

## 2021-02-21 NOTE — ED Provider Notes (Signed)
[facility] Emergency Department Provider Note   ____________________________________________    I have reviewed the triage vital signs and the nursing notes.   HISTORY  Chief Complaint Abdominal Pain and Chest Pain     HPI Isaac Jackson is a 50 y.o. male with a history as noted below who presents with complaints of left-sided flank pain.  Patient reports pain is been severe today, the past 2 days he has felt "off ".  He reports a history of kidney stones but this feels somewhat different.  He does note a history of hernia repairs as well but denies bulging in his groin or scrotum.  Past Medical History:  Diagnosis Date  . Asthma   . History of alcohol abuse    patient reports that he is an alcoholic and goes to AA  . Hypertension   . Kidney stone   . Renal disorder     Patient Active Problem List   Diagnosis Date Noted  . Kidney stones 02/21/2021    Past Surgical History:  Procedure Laterality Date  . ABDOMINAL SURGERY    . APPENDECTOMY    . HERNIA REPAIR      Prior to Admission medications   Medication Sig Start Date End Date Taking? Authorizing Provider  chlordiazePOXIDE (LIBRIUM) 5 MG capsule Take 6 caps (30 mg) 4x daily on day 1. Take 5 caps (25 mg) 4x daily on day 2. Take 4 caps (20 mg) 4x daily on day 3. Take 3 caps (15 mg) 4x daily on day 4. Take 2 caps (10 mg) 4x daily on day 5. Take 2 caps (10 mg) 3x daily on day 6. Take 1 cap (5 mg) 3x daily on day 7. Take 1 cap (5 mg) twice daily on day 8. Take 1 cap (5 mg) in the evening on day 9. 11/21/20   Loleta Rose, MD  ondansetron (ZOFRAN ODT) 4 MG disintegrating tablet Allow 1-2 tablets to dissolve in your mouth every 8 hours as needed for nausea/vomiting 11/21/20   Loleta Rose, MD     Allergies Patient has no known allergies.  No family history on file.  Social History Social History   Tobacco Use  . Smoking status: Current Some Day Smoker    Types: Cigarettes  .  Smokeless tobacco: Never Used  Vaping Use  . Vaping Use: Never used  Substance Use Topics  . Alcohol use: Yes    Comment: daily  . Drug use: Never    Review of Systems  Constitutional: No fever/chills Eyes: No visual changes.  ENT: No sore throat. Cardiovascular: Denies chest pain. Respiratory: Denies shortness of breath. Gastrointestinal: As above Genitourinary: Negative for dysuria.  No hematuria Musculoskeletal: Negative for back pain. Skin: Negative for rash. Neurological: Negative for headaches or weakness   ____________________________________________   PHYSICAL EXAM:  VITAL SIGNS: ED Triage Vitals  Enc Vitals Group     BP 02/21/21 1341 117/81     Pulse Rate 02/21/21 1341 (!) 102     Resp 02/21/21 1341 16     Temp 02/21/21 1341 97.6 F (36.4 C)     Temp src --      SpO2 02/21/21 1341 100 %     Weight 02/21/21 1341 63.5 kg (140 lb)     Height 02/21/21 1341 1.702 m (5\' 7" )     Head Circumference --      Peak Flow --      Pain Score 02/21/21 1340 6  Pain Loc --      Pain Edu? --      Excl. in GC? --     Constitutional: Alert and oriented. Eyes: Conjunctivae are normal.   Nose: No congestion/rhinnorhea. Mouth/Throat: Mucous membranes are moist.    Cardiovascular: Normal rate, regular rhythm. Grossly normal heart sounds.  Good peripheral circulation. Respiratory: Normal respiratory effort.  No retractions. Lungs CTAB. Gastrointestinal: Soft and nontender. No distention.  No CVA tenderness. Genitourinary: No palpable hernia, no bulging or tenderness to palpation, normal scrotum Musculoskeletal: No lower extremity tenderness nor edema.  Warm and well perfused Neurologic:  Normal speech and language. No gross focal neurologic deficits are appreciated.  Skin:  Skin is warm, dry and intact. No rash noted. Psychiatric: Mood and affect are normal. Speech and behavior are normal.  ____________________________________________   LABS (all labs ordered are  listed, but only abnormal results are displayed)  Labs Reviewed  COMPREHENSIVE METABOLIC PANEL - Abnormal; Notable for the following components:      Result Value   Sodium 132 (*)    Potassium 3.1 (*)    Chloride 93 (*)    Glucose, Bld 108 (*)    BUN 23 (*)    Creatinine, Ser 1.47 (*)    Total Bilirubin 1.6 (*)    GFR, Estimated 58 (*)    Anion gap 16 (*)    All other components within normal limits  URINALYSIS, COMPLETE (UACMP) WITH MICROSCOPIC - Abnormal; Notable for the following components:   Color, Urine YELLOW (*)    APPearance CLEAR (*)    Hgb urine dipstick MODERATE (*)    Bacteria, UA RARE (*)    All other components within normal limits  SARS CORONAVIRUS 2 (TAT 6-24 HRS)  LIPASE, BLOOD  CBC  COMPREHENSIVE METABOLIC PANEL  CBC  MAGNESIUM  PHOSPHORUS   ____________________________________________  EKG  ED ECG REPORT I, Jene Every, the attending physician, personally viewed and interpreted this ECG.  Date: 02/21/2021  Rhythm: Sinus tachycardia QRS Axis: normal Intervals: normal ST/T Wave abnormalities: normal Narrative Interpretation: no evidence of acute ischemia  ____________________________________________  RADIOLOGY  CT renal stone study ____________________________________________   PROCEDURES  Procedure(s) performed: No  Procedures   Critical Care performed: No ____________________________________________   INITIAL IMPRESSION / ASSESSMENT AND PLAN / ED COURSE  Pertinent labs & imaging results that were available during my care of the patient were reviewed by me and considered in my medical decision making (see chart for details).  Patient presents with primary complaint of left flank pain with some radiation to the groin.  Highly suspicious for ureterolithiasis especially given his history.  We will treat with IV Toradol, obtain CT renal stone study  Lab work is consistent with dehydration, IV fluids ordered  Patient still with  pain after Toradol, additional dose of morphine given  ----------------------------------------- 7:03 PM on 02/21/2021 -----------------------------------------  CT scan is reassuring, patient still having pain, will order IV Dilaudid, have discussed with the hospitalist for admission for further evaluation    ____________________________________________   FINAL CLINICAL IMPRESSION(S) / ED DIAGNOSES  Final diagnoses:  Flank pain  Intractable pain        Note:  This document was prepared using Dragon voice recognition software and may include unintentional dictation errors.   Jene Every, MD 02/21/21 [phone]

## 2021-02-21 NOTE — H&P (Addendum)
History and Physical  Isaac Jackson MVH:[id number] DOB: Jan 15, 1971 DOA: 02/21/2021  Referring physician: Dr. 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  Chief Complaint: Severe left flank pain radiating to his left groin.  HPI: Isaac Jackson is a 50 y.o. male with medical history significant for kidney stones first diagnosed at the age of 21, previous bowel obstruction, hernia repair, BPH, essential hypertension, chronic anxiety, who presented to [facility] ED due to worsening left flank pain, radiating into his left groin.  Reports he has had intermittent nausea with vomiting in the last 3 days.  Poor fluid intake.  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  Also reports chest pressure earlier which has now resolved.  Work-up in the ED revealed bilateral nonobstructing kidney stones and AKI.  Given IV opiates without any significant relief of his symptoms.  TRH, hospitalist team, asked to admit.  ED Course:  Afebrile.  BP 97/64, pulse 96, respiration rate 18, O2 saturation 97% on room air.  Lab studies remarkable for serum sodium 132, serum potassium 3.1, serum bicarb 23, BUN 23, creatinine 1.47, anion gap 16, total bilirubin 1.6.  Urine analysis positive for moderate hemoglobin on urine dipstick.  CT renal stone study positive for bilateral nonobstructing renal calculi without change in size or number.  Hepatic steatosis.  Small hiatal hernia.  Moderate fecal retention.  Review of Systems: Review of systems as noted in the HPI. All other systems reviewed and are negative.   Past Medical History:  Diagnosis Date  . Asthma   . History of alcohol abuse    patient reports that he is an alcoholic and goes to AA  . Hypertension   . Kidney stone   . Renal disorder    Past Surgical History:  Procedure Laterality Date  . ABDOMINAL SURGERY    . APPENDECTOMY    . HERNIA  REPAIR      Social History:  reports that he has been smoking cigarettes. He has never used smokeless tobacco. He reports current alcohol use. He reports that he does not use drugs.   No Known Allergies  Family history: Maternal grandfather with history of kidney stones. Father deceased with history of stomach and brain cancer.  Prior to Admission medications   Medication Sig Start Date End Date Taking? Authorizing Provider  chlordiazePOXIDE (LIBRIUM) 5 MG capsule Take 6 caps (30 mg) 4x daily on day 1. Take 5 caps (25 mg) 4x daily on day 2. Take 4 caps (20 mg) 4x daily on day 3. Take 3 caps (15 mg) 4x daily on day 4. Take 2 caps (10 mg) 4x daily on day 5. Take 2 caps (10 mg) 3x daily on day 6. Take 1 cap (5 mg) 3x daily on day 7. Take 1 cap (5 mg) twice daily on day 8. Take 1 cap (5 mg) in the evening on day 9. 11/21/20   Loleta Rose, MD  ondansetron (ZOFRAN ODT) 4 MG disintegrating tablet Allow 1-2 tablets to dissolve in your mouth every 8 hours as needed for nausea/vomiting 11/21/20   Loleta Rose, MD    Physical Exam: BP 97/64   Pulse 96   Temp 97.6 F (36.4 C)   Resp 18   Ht 5\' 7"  (1.702 m)   Wt 63.5 kg   SpO2 97%   BMI 21.93 kg/m   . General: 50 y.o. year-old  male well developed well nourished in no acute distress.  Alert and oriented x3.  Appears uncomfortable due to severe pain. . Cardiovascular: Regular rate and rhythm with no rubs or gallops.  No thyromegaly or JVD noted.  No lower extremity edema. 2/4 pulses in all 4 extremities. Marland Kitchen Respiratory: Clear to auscultation with no wheezes or rales. Good inspiratory effort. . Abdomen: Soft nontender nondistended with normal bowel sounds x4 quadrants. . Muskuloskeletal: No cyanosis, clubbing or edema noted bilaterally . Neuro: CN II-XII intact, strength, sensation, reflexes . Skin: No ulcerative lesions noted or rashes . Psychiatry: Judgement and insight appear normal. Mood is appropriate for condition and setting           Labs on Admission:  Basic Metabolic Panel: Recent Labs  Lab 02/21/21 1345  NA 132*  K 3.1*  CL 93*  CO2 23  GLUCOSE 108*  BUN 23*  CREATININE 1.47*  CALCIUM 9.1   Liver Function Tests: Recent Labs  Lab 02/21/21 1345  AST 40  ALT 41  ALKPHOS 56  BILITOT 1.6*  PROT 7.9  ALBUMIN 4.4   Recent Labs  Lab 02/21/21 1345  LIPASE 36   No results for input(s): AMMONIA in the last 168 hours. CBC: Recent Labs  Lab 02/21/21 1345  WBC 9.8  HGB 17.0  HCT 48.0  MCV 89.6  PLT 223   Cardiac Enzymes: No results for input(s): CKTOTAL, CKMB, CKMBINDEX, TROPONINI in the last 168 hours.  BNP (last 3 results) No results for input(s): BNP in the last 8760 hours.  ProBNP (last 3 results) No results for input(s): PROBNP in the last 8760 hours.  CBG: No results for input(s): GLUCAP in the last 168 hours.  Radiological Exams on Admission: CT Renal Stone Study  Result Date: 02/21/2021 CLINICAL DATA:  Left-sided abdominal pain EXAM: CT ABDOMEN AND PELVIS WITHOUT CONTRAST TECHNIQUE: Multidetector CT imaging of the abdomen and pelvis was performed following the standard protocol without IV contrast. COMPARISON:  11/20/2020 FINDINGS: Lower chest: No acute pleural or parenchymal lung disease. Hepatobiliary: Diffuse hepatic steatosis without focal abnormality. No intrahepatic biliary duct dilation. Gallbladder is unremarkable. Pancreas: Unremarkable. No pancreatic ductal dilatation or surrounding inflammatory changes. Spleen: Normal in size without focal abnormality. Adrenals/Urinary Tract: There are numerous bilateral nonobstructing renal calculi without change in size or number since prior study. Largest calculus on the left measures up to 12 mm in the upper pole, and on the right measures up to 4 mm. No obstructive uropathy within either kidney. The adrenals and bladder are grossly normal. Stomach/Bowel: No bowel obstruction or ileus. Moderate retained stool throughout the colon. No bowel wall  thickening or inflammatory change. Small hiatal hernia. Vascular/Lymphatic: No significant vascular findings are present. No enlarged abdominal or pelvic lymph nodes. Reproductive: Prostate is unremarkable. Other: No free fluid or free intraperitoneal gas. No evidence of abdominal wall hernia. Postsurgical changes are seen from bilateral inguinal hernia repairs. Musculoskeletal: No acute or destructive bony lesions. Reconstructed images demonstrate no additional findings. IMPRESSION: 1. Bilateral nonobstructing renal calculi without change in size or number. 2. Hepatic steatosis. 3. Small hiatal hernia. 4. Moderate fecal retention. Electronically Signed   By: Sharlet Salina M.D.   On: 02/21/2021 17:09    EKG: I independently viewed the EKG done and my findings are as followed: Sinus tachycardia rate of 104, nonspecific ST-T changes, QTC 452.  Assessment/Plan Present on Admission: **None**  Active Problems:   Kidney stones  Nonobstructing bilateral renal calculi Presented with severe left flank pain radiating  to his left groin IV fluid hydration Pain control with IV Dilaudid as needed for severe pain, oxycodone as needed for moderate pain, Tylenol as needed for mild pain. Restart home Flomax Mobilize as tolerated  AKI, suspect prerenal in the setting of dehydration from poor oral intake. 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 Repeat renal panel in the morning.  Resolved chest pressure No evidence of acute ischemia on 12 lead EKG Follow troponin His chest pain had resolved by the time of this visit. Monitor on telemetry.  Hypovolemic hyponatremia Presented with serum sodium of 132 Continue IV  fluid hydration Repeat renal panel in the morning  Elevated total bilirubin Presented with total bilirubin of 1.6 Alkaline phosphatase, ALT and AST normal.  Lipase normal. Nonspecific Repeat CMP in the morning.  Intractable nausea and vomiting Suspect in the setting of severe pain from kidney stones IV antiemetics as needed IV fluid hydration  Constipation Started bowel regimen Senokot 2 tablet twice daily and MiraLAX 17 g daily Has declined enema for now.  Essential hypertension BP is at goal Hold off home losartan due to AKI.  Hepatic steatosis on CT renal Recommend lifestyle modification Healthy eating habits, exercise Avoidance of alcohol use  Alcohol use disorder CIWA protocol  Chronic anxiety Resume home regimen  BPH Resume home Flomax    DVT prophylaxis: SCDs  Code Status: Full code as stated by the patient himself.  Family Communication: None at bedside.  Disposition Plan: Admit to MedSurg unit with remote telemetry.  Consults called: None.  Admission status: Observation status.   Status is: Observation   Dispo: The patient is from: Home.              Anticipated d/c is to: Home.              Patient currently not stable for discharge due to ongoing management of kidney stones and AKI.   Difficult to place patient, not applicable.       Darlin Drop MD [facility] Pager [phone]  If 7PM-7AM, please contact night-coverage [email or web address] Password [facility]  02/21/2021, 6:58 PM

## 2021-02-21 NOTE — ED Notes (Addendum)
Pt comes via EMS from [facility] with ETOH on board. Pt states belly pain for 3 days, CP this am and no BM for 3 weeks.  Pt given zofran by EMS

## 2021-02-22 ENCOUNTER — Inpatient Hospital Stay: Payer: 59

## 2021-02-22 DIAGNOSIS — N200 Calculus of kidney: Secondary | ICD-10-CM

## 2021-02-22 DIAGNOSIS — R112 Nausea with vomiting, unspecified: Secondary | ICD-10-CM | POA: Diagnosis present

## 2021-02-22 DIAGNOSIS — N400 Enlarged prostate without lower urinary tract symptoms: Secondary | ICD-10-CM | POA: Diagnosis present

## 2021-02-22 DIAGNOSIS — N179 Acute kidney failure, unspecified: Secondary | ICD-10-CM | POA: Diagnosis present

## 2021-02-22 DIAGNOSIS — F419 Anxiety disorder, unspecified: Secondary | ICD-10-CM | POA: Diagnosis present

## 2021-02-22 DIAGNOSIS — R079 Chest pain, unspecified: Secondary | ICD-10-CM | POA: Diagnosis present

## 2021-02-22 DIAGNOSIS — Z7141 Alcohol abuse counseling and surveillance of alcoholic: Secondary | ICD-10-CM | POA: Diagnosis not present

## 2021-02-22 DIAGNOSIS — Z20822 Contact with and (suspected) exposure to covid-19: Secondary | ICD-10-CM | POA: Diagnosis present

## 2021-02-22 DIAGNOSIS — K5900 Constipation, unspecified: Secondary | ICD-10-CM | POA: Diagnosis present

## 2021-02-22 DIAGNOSIS — R17 Unspecified jaundice: Secondary | ICD-10-CM | POA: Diagnosis present

## 2021-02-22 DIAGNOSIS — K760 Fatty (change of) liver, not elsewhere classified: Secondary | ICD-10-CM | POA: Diagnosis present

## 2021-02-22 DIAGNOSIS — E861 Hypovolemia: Secondary | ICD-10-CM | POA: Diagnosis present

## 2021-02-22 DIAGNOSIS — F1010 Alcohol abuse, uncomplicated: Secondary | ICD-10-CM | POA: Diagnosis present

## 2021-02-22 DIAGNOSIS — E876 Hypokalemia: Secondary | ICD-10-CM | POA: Diagnosis present

## 2021-02-22 DIAGNOSIS — E871 Hypo-osmolality and hyponatremia: Secondary | ICD-10-CM | POA: Diagnosis present

## 2021-02-22 DIAGNOSIS — R0789 Other chest pain: Secondary | ICD-10-CM | POA: Diagnosis present

## 2021-02-22 DIAGNOSIS — Z87442 Personal history of urinary calculi: Secondary | ICD-10-CM | POA: Diagnosis not present

## 2021-02-22 DIAGNOSIS — E860 Dehydration: Secondary | ICD-10-CM | POA: Diagnosis present

## 2021-02-22 DIAGNOSIS — Z5320 Procedure and treatment not carried out because of patient's decision for unspecified reasons: Secondary | ICD-10-CM | POA: Diagnosis not present

## 2021-02-22 DIAGNOSIS — F17210 Nicotine dependence, cigarettes, uncomplicated: Secondary | ICD-10-CM | POA: Diagnosis present

## 2021-02-22 DIAGNOSIS — I10 Essential (primary) hypertension: Secondary | ICD-10-CM | POA: Diagnosis present

## 2021-02-22 DIAGNOSIS — R251 Tremor, unspecified: Secondary | ICD-10-CM | POA: Diagnosis not present

## 2021-02-22 LAB — CBC
HCT: 41.1 % (ref 39.0–52.0)
Hemoglobin: 14.1 g/dL (ref 13.0–17.0)
MCH: 31.8 pg (ref 26.0–34.0)
MCHC: 34.3 g/dL (ref 30.0–36.0)
MCV: 92.8 fL (ref 80.0–100.0)
Platelets: 170 10*3/uL (ref 150–400)
RBC: 4.43 MIL/uL (ref 4.22–5.81)
RDW: 14.2 % (ref 11.5–15.5)
WBC: 8.8 10*3/uL (ref 4.0–10.5)
nRBC: 0 % (ref 0.0–0.2)

## 2021-02-22 LAB — COMPREHENSIVE METABOLIC PANEL
ALT: 34 U/L (ref 0–44)
AST: 35 U/L (ref 15–41)
Albumin: 4 g/dL (ref 3.5–5.0)
Alkaline Phosphatase: 48 U/L (ref 38–126)
Anion gap: 10 (ref 5–15)
BUN: 32 mg/dL — ABNORMAL HIGH (ref 6–20)
CO2: 28 mmol/L (ref 22–32)
Calcium: 8.6 mg/dL — ABNORMAL LOW (ref 8.9–10.3)
Chloride: 96 mmol/L — ABNORMAL LOW (ref 98–111)
Creatinine, Ser: 2.14 mg/dL — ABNORMAL HIGH (ref 0.61–1.24)
GFR, Estimated: 37 mL/min — ABNORMAL LOW (ref >60)
Glucose, Bld: 103 mg/dL — ABNORMAL HIGH (ref 70–99)
Potassium: 3.6 mmol/L (ref 3.5–5.1)
Sodium: 134 mmol/L — ABNORMAL LOW (ref 135–145)
Total Bilirubin: 1.7 mg/dL — ABNORMAL HIGH (ref 0.3–1.2)
Total Protein: 6.6 g/dL (ref 6.5–8.1)

## 2021-02-22 LAB — MAGNESIUM: Magnesium: 2.1 mg/dL (ref 1.7–2.4)

## 2021-02-22 LAB — PHOSPHORUS
Phosphorus: 3.9 mg/dL (ref 2.5–4.6)
Phosphorus: 3.2 mg/dL (ref 2.5–4.6)

## 2021-02-22 LAB — SARS CORONAVIRUS 2 (TAT 6-24 HRS): SARS Coronavirus 2: NEGATIVE

## 2021-02-22 LAB — TROPONIN I (HIGH SENSITIVITY): Troponin I (High Sensitivity): 8 ng/L (ref <18)

## 2021-02-22 MED ORDER — LORAZEPAM 1 MG PO TABS
1.0000 mg | ORAL_TABLET | ORAL | Status: DC | PRN
  Administered 2021-02-22 (×2): 2 mg via ORAL
  Administered 2021-02-22: 1 mg via ORAL
  Administered 2021-02-22 (×2): 2 mg via ORAL
  Filled 2021-02-22 (×3): qty 2
  Filled 2021-02-22: qty 1
  Filled 2021-02-22: qty 2

## 2021-02-22 MED ORDER — THIAMINE HCL 100 MG PO TABS: 100.0000 mg | ORAL_TABLET | Freq: Every day | ORAL | Status: DC

## 2021-02-22 MED ORDER — CHLORDIAZEPOXIDE HCL 25 MG PO CAPS: 25.0000 mg | ORAL_CAPSULE | ORAL | Status: DC

## 2021-02-22 MED ORDER — SODIUM CHLORIDE 0.9 % IV BOLUS
1000.0000 mL | Freq: Once | INTRAVENOUS | Status: AC
  Administered 2021-02-22: 1000 mL via INTRAVENOUS

## 2021-02-22 MED ORDER — POLYETHYLENE GLYCOL 3350 17 G PO PACK
17.0000 g | PACK | Freq: Two times a day (BID) | ORAL | Status: DC
  Administered 2021-02-23: 17 g via ORAL
  Filled 2021-02-22: qty 1

## 2021-02-22 MED ORDER — HEPARIN SODIUM (PORCINE) 5000 UNIT/ML IJ SOLN
5000.0000 [IU] | Freq: Three times a day (TID) | INTRAMUSCULAR | Status: DC
  Administered 2021-02-22 – 2021-02-23 (×3): 5000 [IU] via SUBCUTANEOUS
  Filled 2021-02-22 (×3): qty 1

## 2021-02-22 MED ORDER — THIAMINE HCL 100 MG/ML IJ SOLN: 100.0000 mg | Freq: Once | INTRAMUSCULAR | Status: DC

## 2021-02-22 MED ORDER — ADULT MULTIVITAMIN W/MINERALS CH
1.0000 | ORAL_TABLET | Freq: Every day | ORAL | Status: DC
  Administered 2021-02-23: 1 via ORAL
  Filled 2021-02-22: qty 1

## 2021-02-22 MED ORDER — LORAZEPAM 2 MG/ML IJ SOLN
1.0000 mg | INTRAMUSCULAR | Status: DC | PRN
Start: 2021-02-22 — End: 2021-02-23
  Administered 2021-02-22: 3 mg via INTRAVENOUS
  Administered 2021-02-23: 1 mg via INTRAVENOUS
  Filled 2021-02-22 (×2): qty 1
  Filled 2021-02-22: qty 2

## 2021-02-22 MED ORDER — THIAMINE HCL 100 MG/ML IJ SOLN: 100.0000 mg | Freq: Every day | INTRAMUSCULAR | Status: DC

## 2021-02-22 MED ORDER — THIAMINE HCL 100 MG PO TABS
100.0000 mg | ORAL_TABLET | Freq: Every day | ORAL | Status: DC
  Administered 2021-02-23: 100 mg via ORAL
  Filled 2021-02-22: qty 1

## 2021-02-22 MED ORDER — CHLORDIAZEPOXIDE HCL 25 MG PO CAPS
25.0000 mg | ORAL_CAPSULE | Freq: Four times a day (QID) | ORAL | Status: AC
  Administered 2021-02-22 (×4): 25 mg via ORAL
  Filled 2021-02-22 (×4): qty 1

## 2021-02-22 MED ORDER — CHLORDIAZEPOXIDE HCL 25 MG PO CAPS
25.0000 mg | ORAL_CAPSULE | Freq: Three times a day (TID) | ORAL | Status: DC
  Administered 2021-02-23: 25 mg via ORAL
  Filled 2021-02-22: qty 1

## 2021-02-22 MED ORDER — THIAMINE HCL 100 MG/ML IJ SOLN
Freq: Once | INTRAVENOUS | Status: AC
  Filled 2021-02-22: qty 1000

## 2021-02-22 MED ORDER — LOPERAMIDE HCL 2 MG PO CAPS
2.0000 mg | ORAL_CAPSULE | ORAL | Status: DC | PRN
  Filled 2021-02-22: qty 2

## 2021-02-22 MED ORDER — CHLORDIAZEPOXIDE HCL 25 MG PO CAPS: 25.0000 mg | ORAL_CAPSULE | Freq: Four times a day (QID) | ORAL | Status: DC | PRN

## 2021-02-22 MED ORDER — CHLORDIAZEPOXIDE HCL 25 MG PO CAPS: 25.0000 mg | ORAL_CAPSULE | Freq: Every day | ORAL | Status: DC

## 2021-02-22 MED ORDER — ONDANSETRON 4 MG PO TBDP: 4.0000 mg | ORAL_TABLET | Freq: Four times a day (QID) | ORAL | Status: DC | PRN

## 2021-02-22 MED ORDER — FOLIC ACID 1 MG PO TABS
1.0000 mg | ORAL_TABLET | Freq: Every day | ORAL | Status: DC
  Administered 2021-02-23: 1 mg via ORAL
  Filled 2021-02-22: qty 1

## 2021-02-22 MED ORDER — HYDROXYZINE HCL 25 MG PO TABS: 25.0000 mg | ORAL_TABLET | Freq: Three times a day (TID) | ORAL | Status: DC | PRN

## 2021-02-22 MED ORDER — HYDROXYZINE HCL 25 MG PO TABS
25.0000 mg | ORAL_TABLET | Freq: Four times a day (QID) | ORAL | Status: DC | PRN
  Administered 2021-02-22: 25 mg via ORAL
  Filled 2021-02-22: qty 1

## 2021-02-22 MED ORDER — BISACODYL 10 MG RE SUPP
10.0000 mg | Freq: Once | RECTAL | Status: AC
  Administered 2021-02-22: 10 mg via RECTAL
  Filled 2021-02-22: qty 1

## 2021-02-22 NOTE — Consult Note (Signed)
Urology Consult  I have been asked to see the patient by Dr. Janee Morn, for evaluation and management of bilateral nephrolithiasis.  Chief Complaint: left flank, chest pain  History of Present Illness: Isaac Jackson is a 50 y.o. year old male with PMH nephrolithiasis, BPH on Flomax, hernia repair, bowel obstruction, and EtOH abuse who presented to the ED yesterday with reports of LLQ and groin pain x3 days, chest pain, and constipation x3 weeks. He reported poor fluid intake as well.  Admission labs notable for creatinine 1.47 (baseline 0.8); UA without nitrites, pyuria, hematuria, and with rare bacteria; and WBC count 9.8. He underwent CT stone study, which revealed multiple bilateral nonobstructing renal stones without hydronephrosis and moderate fecal retention.  WBC count down today, 8.8. Creatinine up today, 2.14.  Today patient reports an extensive history of nephrolithiasis starting at age 28.  He has required numerous interventions including ureteroscopy and shockwave lithotripsy and states his ureters are "scarred" as a result of these.  He states his left flank pain radiating to the groin is similar to past stone episodes.  He denies dysuria and scrotal edema, but feels he is straining to void.  He states his pain has improved since admission yesterday.  Past Medical History:  Diagnosis Date  . Asthma   . History of alcohol abuse    patient reports that he is an alcoholic and goes to AA  . Hypertension   . Kidney stone   . Renal disorder     Past Surgical History:  Procedure Laterality Date  . ABDOMINAL SURGERY    . APPENDECTOMY    . HERNIA REPAIR      Home Medications:  Current Meds  Medication Sig  . albuterol (VENTOLIN HFA) 108 (90 Base) MCG/ACT inhaler Inhale 1-2 puffs into the lungs every 6 (six) hours as needed.  . ALPRAZolam (XANAX) 1 MG tablet Take 1 mg by mouth daily.  . hydrochlorothiazide (HYDRODIURIL) 12.5 MG tablet Take 12.5 mg by mouth daily.  .  hydrOXYzine (VISTARIL) 25 MG capsule Take 25 mg by mouth 3 (three) times daily as needed.  Marland Kitchen losartan (COZAAR) 100 MG tablet Take 1 tablet by mouth daily.  . ondansetron (ZOFRAN ODT) 4 MG disintegrating tablet Allow 1-2 tablets to dissolve in your mouth every 8 hours as needed for nausea/vomiting  . tamsulosin (FLOMAX) 0.4 MG CAPS capsule Take 0.4 mg by mouth daily.    Allergies: No Known Allergies  No family history on file.  Social History:  reports that he has been smoking cigarettes. He has never used smokeless tobacco. He reports current alcohol use. He reports that he does not use drugs.  ROS: A complete review of systems was performed.  All systems are negative except for pertinent findings as noted.  Physical Exam:  Vital signs in last 24 hours: Temp:  [97.6 F (36.4 C)-98.5 F (36.9 C)] 98.5 F (36.9 C) (05/03 0912) Pulse Rate:  [79-102] 86 (05/03 0912) Resp:  [16-20] 18 (05/03 0912) BP: (89-132)/(62-89) 132/89 (05/03 0912) SpO2:  [93 %-100 %] 96 % (05/03 0912) Weight:  [63.5 kg] 63.5 kg (05/02 1341) Constitutional:  Alert and oriented, tremulous, no acute distress HEENT: [place] AT, moist mucus membranes Cardiovascular: No clubbing, cyanosis, or edema Respiratory: Normal respiratory effort GU: Left CVA tenderness.  Bilateral descended testicles without scrotal edema or crepitus.  Nontender epididymides. Skin: No rashes, bruises or suspicious lesions Neurologic: Grossly intact, no focal deficits, moving all 4 extremities Psychiatric: Normal mood and affect  Laboratory Data:  Recent Labs    02/21/21 1345 02/22/21 0407  WBC 9.8 8.8  HGB 17.0 14.1  HCT 48.0 41.1   Recent Labs    02/21/21 1345 02/22/21 0407  NA 132* 134*  K 3.1* 3.6  CL 93* 96*  CO2 23 28  GLUCOSE 108* 103*  BUN 23* 32*  CREATININE 1.47* 2.14*  CALCIUM 9.1 8.6*   Urinalysis    Component Value Date/Time   COLORURINE YELLOW (A) 02/21/2021 1345   APPEARANCEUR CLEAR (A) 02/21/2021 1345    LABSPEC 1.005 02/21/2021 1345   PHURINE 7.0 02/21/2021 1345   GLUCOSEU NEGATIVE 02/21/2021 1345   HGBUR MODERATE (A) 02/21/2021 1345   BILIRUBINUR NEGATIVE 02/21/2021 1345   KETONESUR NEGATIVE 02/21/2021 1345   PROTEINUR NEGATIVE 02/21/2021 1345   NITRITE NEGATIVE 02/21/2021 1345   LEUKOCYTESUR NEGATIVE 02/21/2021 1345   Radiologic Imaging: CT Renal Stone Study  Result Date: 02/21/2021 CLINICAL DATA:  Left-sided abdominal pain EXAM: CT ABDOMEN AND PELVIS WITHOUT CONTRAST TECHNIQUE: Multidetector CT imaging of the abdomen and pelvis was performed following the standard protocol without IV contrast. COMPARISON:  11/20/2020 FINDINGS: Lower chest: No acute pleural or parenchymal lung disease. Hepatobiliary: Diffuse hepatic steatosis without focal abnormality. No intrahepatic biliary duct dilation. Gallbladder is unremarkable. Pancreas: Unremarkable. No pancreatic ductal dilatation or surrounding inflammatory changes. Spleen: Normal in size without focal abnormality. Adrenals/Urinary Tract: There are numerous bilateral nonobstructing renal calculi without change in size or number since prior study. Largest calculus on the left measures up to 12 mm in the upper pole, and on the right measures up to 4 mm. No obstructive uropathy within either kidney. The adrenals and bladder are grossly normal. Stomach/Bowel: No bowel obstruction or ileus. Moderate retained stool throughout the colon. No bowel wall thickening or inflammatory change. Small hiatal hernia. Vascular/Lymphatic: No significant vascular findings are present. No enlarged abdominal or pelvic lymph nodes. Reproductive: Prostate is unremarkable. Other: No free fluid or free intraperitoneal gas. No evidence of abdominal wall hernia. Postsurgical changes are seen from bilateral inguinal hernia repairs. Musculoskeletal: No acute or destructive bony lesions. Reconstructed images demonstrate no additional findings. IMPRESSION: 1. Bilateral nonobstructing renal  calculi without change in size or number. 2. Hepatic steatosis. 3. Small hiatal hernia. 4. Moderate fecal retention. Electronically Signed   By: Sharlet Salina M.D.   On: 02/21/2021 17:09   Assessment & Plan:  50 year old male with PMH nephrolithiasis, BPH, hernia repair, bowel obstruction, and EtOH abuse admitted with LLQ pain radiating to the groin as well as chest pain and poor p.o. intake and found to have AKI with CT findings of stable nonobstructing bilateral nephrolithiasis.  Admission UA benign, low concern for infection.  Recommend renal ultrasound for reevaluation of possible hydronephrosis in the setting of rehydration and increased creatinine today.  In the absence of hydronephrosis, I do not believe his nonobstructing bilateral renal stones are the source of his pain.  Agree with supportive care and bowel regimen for management of his moderate stool burden on CT.  Thank you for involving me in this patient's care, please page with any further questions or concerns.  Carman Ching, PA-C 02/22/2021 9:33 AM

## 2021-02-22 NOTE — Progress Notes (Signed)
PROGRESS NOTE    Isaac Jackson  WUJ:[id number] DOB: [birth year] DOA: 02/21/2021 PCP: Pcp, No (Confirm with patient/family/NH records and if not entered, this HAS to be entered at [facility] point of entry. "No PCP" if truly none.)   Chief Complaint  Patient presents with  . Abdominal Pain  . Chest Pain    Brief Narrative:  Patient is a 50 year old gentleman history of kidney stones first diagnosed at age 33, prior bowel obstruction, history of hernia repair, BPH, hypertension, chronic anxiety presented to [facility] ED secondary to worsening left flank pain radiating to his left groin, intermittent nausea and vomiting x3 days with poor oral intake.  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  Patient also with some complaints of chest pressure.  CT stone protocol revealed bilateral nonobstructing kidney stones with acute kidney injury as well as concern for fecal retention..  Patient given IV opiates with no significant relief of his symptoms.  Patient placed on IV fluids, pain management. Urology consulted.   Assessment & Plan:   Principal Problem:   Bilateral nephrolithiasis Active Problems:   Kidney stones   Acute renal failure (ARF) (HCC)   Hypokalemia   Chest pain   Hyponatremia   Nausea and vomiting   Elevated bilirubin   Constipation   HTN (hypertension)   BPH (benign prostatic hyperplasia)   Alcohol use disorder, mild, abuse   Anxiety  1 bilateral nonobstructing renal calculi -Patient presented with initially severe left flank pain radiating to his left groin, now with right flank pain radiating to his right testicle per patient. -Urinalysis done nitrite negative, leukocytes negative, 0-5 WBCs. -CT renal stone protocol done with bilateral nonobstructing renal calculi. -Continue IV fluids, IV antiemetics, pain management, supportive care. -Consult with urology for further evaluation and management.  2.  Acute renal failure -Likely secondary to prerenal azotemia in the  setting of ARB, Toradol. -Patient noted to be also hypotensive on presentation. -Urinalysis done negative for proteinuria, moderate hemoglobin noted -Creatinine worsened currently at 2.14 from 1.47 on admission -Check urine sodium, urine creatinine, renal ultrasound. -1 L bolus of IV fluids.\ -Increase IV fluids to LR 125 cc/h. -Monitor UOP. -Continue to hold ARB, nephrotoxins. -Urology consult.  3.  Hypokalemia -Likely secondary to GI losses. -Replete.  4.  Resolved chest pressure -EKG with no acute ischemic changes noted -High-sensitivity troponin negative -Currently chest pain-free. -No further work-up needed at this time.  5.  Hypovolemic hyponatremia -Improved with hydration.  6.  Elevated total bilirubin -Patient presented with total bilirubin of 1.6. -Alkaline phosphatase, ALT and AST within normal limits.  Lipase normal. -Follow.  7.  Intractable nausea vomiting -Likely secondary to problem #1. -Continue IV antiemetics, IV fluids, supportive care.  8.  Constipation -CT renal stone protocol with fecal retention. -Patient refusing enema at this time. -Placed on Dulcolax suppository x1.  Continue Senokot-S twice daily.  Increase MiraLAX to twice daily  9.  Hypertension Continue to hold ARB.  10.  Hepatic steatosis on CT scan -Lifestyle modification.  11.  Alcohol use disorder  -Patient with nausea, dry heaves, significant tremors.  RN concerned patient may be going into significant withdrawal. -Placed on Librium detox protocol in addition to Ativan withdrawal protocol. -Continue thiamine, folic acid, multivitamin.  12.  BPH -Flomax.  13.  Chronic anxiety -On Ativan withdrawal protocol and Librium.   -DC home regimen of anxiolytics.    DVT prophylaxis: Heparin Code Status: Full Family Communication: Updated patient.  No family at bedside  Disposition:   Status is: Inpatient    Dispo:  Patient From: Home  Planned Disposition: Home  Medically  stable for discharge: No      Consultants:   Urology pending  Procedures:   CT renal stone protocol 02/21/2021  Antimicrobials:   None   Subjective: Sleeping but arousable.  Patient noted to have some nausea and dry heaves per RN.  Patient still complaining of bilateral flank pain.  Patient states pain radiating down and now it is radiating to his right testicle.  No chest pain.  No shortness of breath.  Asking for pain medication to be adjusted.  Minimal bowel movement.  Does not want an enema at this time. Per RN patient with nausea, dry heaves, significant tremors early on.  Objective: Vitals:   02/22/21 0409 02/22/21 0912 02/22/21 1108 02/22/21 1610  BP: 95/62 132/89 123/85 124/73  Pulse: 79 86 74 (!) 102  Resp: 20 18 16 16   Temp: 97.7 F (36.5 C) 98.5 F (36.9 C) 98.4 F (36.9 C) 98.6 F (37 C)  TempSrc:  Oral Oral   SpO2: 96% 96% 92% 96%  Weight:      Height:        Intake/Output Summary (Last 24 hours) at 02/22/2021 1638 Last data filed at 02/22/2021 1225 Gross per 24 hour  Intake 1948.96 ml  Output 300 ml  Net 1648.96 ml   Filed Weights   02/21/21 1341  Weight: 63.5 kg    Examination:  General exam: Slight tremors Respiratory system: Clear to auscultation. Respiratory effort normal. Cardiovascular system: S1 & S2 heard, RRR. No JVD, murmurs, rubs, gallops or clicks. No pedal edema. Gastrointestinal system: Abdomen is nondistended, soft and nontender.  Bilateral flank pain to palpation.  No organomegaly or masses felt. Normal bowel sounds heard. Central nervous system: Alert and oriented. No focal neurological deficits. Extremities: Symmetric 5 x 5 power. Skin: No rashes, lesions or ulcers Psychiatry: Judgement and insight appear normal. Mood & affect appropriate.     Data Reviewed: I have personally reviewed following labs and imaging studies  CBC: Recent Labs  Lab 02/21/21 1345 02/22/21 0407  WBC 9.8 8.8  HGB 17.0 14.1  HCT 48.0 41.1  MCV  89.6 92.8  PLT 223 170    Basic Metabolic Panel: Recent Labs  Lab 02/21/21 1345 02/22/21 0407 02/22/21 1043  NA 132* 134*  --   K 3.1* 3.6  --   CL 93* 96*  --   CO2 23 28  --   GLUCOSE 108* 103*  --   BUN 23* 32*  --   CREATININE 1.47* 2.14*  --   CALCIUM 9.1 8.6*  --   MG  --  2.1  --   PHOS  --  3.9 3.2    GFR: Estimated Creatinine Clearance: 37.5 mL/min (A) (by C-G formula based on SCr of 2.14 mg/dL (H)).  Liver Function Tests: Recent Labs  Lab 02/21/21 1345 02/22/21 0407  AST 40 35  ALT 41 34  ALKPHOS 56 48  BILITOT 1.6* 1.7*  PROT 7.9 6.6  ALBUMIN 4.4 4.0    CBG: No results for input(s): GLUCAP in the last 168 hours.   Recent Results (from the past 240 hour(s))  SARS CORONAVIRUS 2 (TAT 6-24 HRS) Nasopharyngeal Nasopharyngeal Swab     Status: None   Collection Time: 02/21/21  8:47 PM   Specimen: Nasopharyngeal Swab  Result Value Ref Range Status   SARS Coronavirus 2 NEGATIVE NEGATIVE Final    Comment: (  NOTE) SARS-CoV-2 target nucleic acids are NOT DETECTED.  The SARS-CoV-2 RNA is generally detectable in upper and lower respiratory specimens during the acute phase of infection. Negative results do not preclude SARS-CoV-2 infection, do not rule out co-infections with other pathogens, and should not be used as the sole basis for treatment or other patient management decisions. Negative results must be combined with clinical observations, patient history, and epidemiological information. The expected result is Negative.  Fact Sheet for Patients: HairSlick.no  Fact Sheet for Healthcare Providers: quierodirigir.com  This test is not yet approved or cleared by the [place] FDA and  has been authorized for detection and/or diagnosis of SARS-CoV-2 by FDA under an Emergency Use Authorization (EUA). This EUA will remain  in effect (meaning this test can be used) for the duration of the COVID-19  declaration under Se ction 564(b)(1) of the Act, 21 U.S.C. section 360bbb-3(b)(1), unless the authorization is terminated or revoked sooner.  Performed at [facility], [address], [place], [place] [address]          Radiology Studies: CT Renal Stone Study  Result Date: 02/21/2021 CLINICAL DATA:  Left-sided abdominal pain EXAM: CT ABDOMEN AND PELVIS WITHOUT CONTRAST TECHNIQUE: Multidetector CT imaging of the abdomen and pelvis was performed following the standard protocol without IV contrast. COMPARISON:  11/20/2020 FINDINGS: Lower chest: No acute pleural or parenchymal lung disease. Hepatobiliary: Diffuse hepatic steatosis without focal abnormality. No intrahepatic biliary duct dilation. Gallbladder is unremarkable. Pancreas: Unremarkable. No pancreatic ductal dilatation or surrounding inflammatory changes. Spleen: Normal in size without focal abnormality. Adrenals/Urinary Tract: There are numerous bilateral nonobstructing renal calculi without change in size or number since prior study. Largest calculus on the left measures up to 12 mm in the upper pole, and on the right measures up to 4 mm. No obstructive uropathy within either kidney. The adrenals and bladder are grossly normal. Stomach/Bowel: No bowel obstruction or ileus. Moderate retained stool throughout the colon. No bowel wall thickening or inflammatory change. Small hiatal hernia. Vascular/Lymphatic: No significant vascular findings are present. No enlarged abdominal or pelvic lymph nodes. Reproductive: Prostate is unremarkable. Other: No free fluid or free intraperitoneal gas. No evidence of abdominal wall hernia. Postsurgical changes are seen from bilateral inguinal hernia repairs. Musculoskeletal: No acute or destructive bony lesions. Reconstructed images demonstrate no additional findings. IMPRESSION: 1. Bilateral nonobstructing renal calculi without change in size or number. 2. Hepatic steatosis. 3. Small hiatal hernia. 4.  Moderate fecal retention. Electronically Signed   By: Sharlet Salina M.D.   On: 02/21/2021 17:09        Scheduled Meds: . chlordiazePOXIDE  25 mg Oral QID   Followed by  . [START ON 02/23/2021] chlordiazePOXIDE  25 mg Oral TID   Followed by  . [START ON 02/24/2021] chlordiazePOXIDE  25 mg Oral BH-qamhs   Followed by  . [START ON 02/25/2021] chlordiazePOXIDE  25 mg Oral Daily  . folic acid  1 mg Oral Daily  . heparin injection (subcutaneous)  5,000 Units Subcutaneous Q8H  . LORazepam  0.5 mg Intravenous Once  . multivitamin with minerals  1 tablet Oral Daily  . polyethylene glycol  17 g Oral Daily  . senna-docusate  2 tablet Oral BID  . tamsulosin  0.4 mg Oral Daily  . thiamine  100 mg Intramuscular Once  . [START ON 02/23/2021] thiamine  100 mg Oral Daily   Continuous Infusions: . lactated ringers with kcl 125 mL/hr at 02/22/21 1054     LOS:  0 days    Time spent: 45 minutes    Ramiro Harvest, MD [facility]   To contact the attending provider between 7A-7P or the covering provider during after hours 7P-7A, please log into the web site [email or web address] and access using universal [place] password for that web site. If you do not have the password, please call the hospital operator.  02/22/2021, 4:38 PM

## 2021-02-22 NOTE — TOC Initial Note (Signed)
Transition of Care [facility]) - Initial/Assessment Note    Patient Details  Name: Isaac Jackson MRN: [id number] Date of Birth: Nov 23, 1970  [facility] CM/SW Contact:    Gildardo Griffes, LCSW Phone Number: 02/22/2021, 1:17 PM  Clinical Narrative:                  CSW received consult for substance abuse resources. Spoke with patient in room regarding any needs. Patient reports he was 90 days sober, showed CSW his AA chip. Reports he goes to AA Groups and has a sponsor. Reports he recently had a few drinks, is in high spirits that he can return to sobriety. Reports his main concern right now his is physical health medically, reports drinking to help with pain however regrets this decision. Reports he is eager to medically resolve his issues. Reports no need for local substance abuse resources at this time as he lives in [place] and has his group he attends, reports being very involved in recovery resources with positive supports which include his sponsor and his mother  Please contact CSW should future needs arise. .   Expected Discharge Plan: Home/Self Care Barriers to Discharge: Continued Medical Work up   Patient Goals and CMS Choice   CMS Medicare.gov Compare Post Acute Care list provided to:: Patient    Expected Discharge Plan and Services Expected Discharge Plan: Home/Self Care       Living arrangements for the past 2 months: Single Family Home                                      Prior Living Arrangements/Services Living arrangements for the past 2 months: Single Family Home Lives with:: Self   Do you feel safe going back to the place where you live?: Yes      Need for Family Participation in Patient Care: No (Comment) Care giver support system in place?: Yes (comment)      Activities of Daily Living Home Assistive Devices/Equipment: None ADL Screening (condition at time of admission) Patient's cognitive ability adequate to safely complete daily  activities?: Yes Is the patient deaf or have difficulty hearing?: No Does the patient have difficulty seeing, even when wearing glasses/contacts?: No Does the patient have difficulty concentrating, remembering, or making decisions?: No Patient able to express need for assistance with ADLs?: Yes Does the patient have difficulty dressing or bathing?: No Independently performs ADLs?: Yes (appropriate for developmental age) Does the patient have difficulty walking or climbing stairs?: No Weakness of Legs: None Weakness of Arms/Hands: None  Permission Sought/Granted                  Emotional Assessment   Attitude/Demeanor/Rapport: Gracious Affect (typically observed): Calm Orientation: : Oriented to Self,Oriented to Place,Oriented to  Time,Oriented to Situation Alcohol / Substance Use: Alcohol Use Psych Involvement: No (comment)  Admission diagnosis:  Kidney stones [N20.0] Flank pain [R10.9] Intractable pain [R52] Patient Active Problem List   Diagnosis Date Noted  . Kidney stones 02/21/2021   PCP:  Pcp, No Pharmacy:  No Pharmacies Listed    Social Determinants of Health (SDOH) Interventions    Readmission Risk Interventions No flowsheet data found.

## 2021-02-23 DIAGNOSIS — F1010 Alcohol abuse, uncomplicated: Secondary | ICD-10-CM | POA: Diagnosis not present

## 2021-02-23 DIAGNOSIS — N400 Enlarged prostate without lower urinary tract symptoms: Secondary | ICD-10-CM | POA: Diagnosis not present

## 2021-02-23 DIAGNOSIS — N179 Acute kidney failure, unspecified: Secondary | ICD-10-CM | POA: Diagnosis not present

## 2021-02-23 DIAGNOSIS — N200 Calculus of kidney: Secondary | ICD-10-CM | POA: Diagnosis not present

## 2021-02-23 LAB — COMPREHENSIVE METABOLIC PANEL
ALT: 30 U/L (ref 0–44)
AST: 32 U/L (ref 15–41)
Albumin: 3.3 g/dL — ABNORMAL LOW (ref 3.5–5.0)
Alkaline Phosphatase: 50 U/L (ref 38–126)
Anion gap: 6 (ref 5–15)
BUN: 22 mg/dL — ABNORMAL HIGH (ref 6–20)
CO2: 25 mmol/L (ref 22–32)
Calcium: 8.2 mg/dL — ABNORMAL LOW (ref 8.9–10.3)
Chloride: 104 mmol/L (ref 98–111)
Creatinine, Ser: 1.16 mg/dL (ref 0.61–1.24)
GFR, Estimated: >60 mL/min (ref >60)
Glucose, Bld: 74 mg/dL (ref 70–99)
Potassium: 4.2 mmol/L (ref 3.5–5.1)
Sodium: 135 mmol/L (ref 135–145)
Total Bilirubin: 1 mg/dL (ref 0.3–1.2)
Total Protein: 5.6 g/dL — ABNORMAL LOW (ref 6.5–8.1)

## 2021-02-23 LAB — CBC WITH DIFFERENTIAL/PLATELET
Abs Immature Granulocytes: 0.06 10*3/uL (ref 0.00–0.07)
Basophils Absolute: 0.1 10*3/uL (ref 0.0–0.1)
Basophils Relative: 0 %
Eosinophils Absolute: 0.3 10*3/uL (ref 0.0–0.5)
Eosinophils Relative: 2 %
HCT: 37.4 % — ABNORMAL LOW (ref 39.0–52.0)
Hemoglobin: 12.9 g/dL — ABNORMAL LOW (ref 13.0–17.0)
Immature Granulocytes: 1 %
Lymphocytes Relative: 17 %
Lymphs Abs: 2 10*3/uL (ref 0.7–4.0)
MCH: 32.1 pg (ref 26.0–34.0)
MCHC: 34.5 g/dL (ref 30.0–36.0)
MCV: 93 fL (ref 80.0–100.0)
Monocytes Absolute: 1.3 10*3/uL — ABNORMAL HIGH (ref 0.1–1.0)
Monocytes Relative: 11 %
Neutro Abs: 7.9 10*3/uL — ABNORMAL HIGH (ref 1.7–7.7)
Neutrophils Relative %: 69 %
Platelets: 132 10*3/uL — ABNORMAL LOW (ref 150–400)
RBC: 4.02 MIL/uL — ABNORMAL LOW (ref 4.22–5.81)
RDW: 13.9 % (ref 11.5–15.5)
WBC: 11.5 10*3/uL — ABNORMAL HIGH (ref 4.0–10.5)
nRBC: 0 % (ref 0.0–0.2)

## 2021-02-23 LAB — MAGNESIUM: Magnesium: 1.8 mg/dL (ref 1.7–2.4)

## 2021-02-23 MED ORDER — SENNOSIDES-DOCUSATE SODIUM 8.6-50 MG PO TABS: 2.0000 | ORAL_TABLET | Freq: Two times a day (BID) | ORAL | 1 refills | Status: DC

## 2021-02-23 MED ORDER — POLYETHYLENE GLYCOL 3350 17 G PO PACK: 17.0000 g | PACK | Freq: Two times a day (BID) | ORAL | 0 refills | Status: DC

## 2021-02-23 MED ORDER — ADULT MULTIVITAMIN W/MINERALS CH: 1.0000 | ORAL_TABLET | Freq: Every day | ORAL | Status: DC

## 2021-02-23 MED ORDER — THIAMINE HCL 100 MG PO TABS: 100.0000 mg | ORAL_TABLET | Freq: Every day | ORAL | 0 refills | Status: DC

## 2021-02-23 MED ORDER — ONDANSETRON 4 MG PO TBDP: 4.0000 mg | ORAL_TABLET | Freq: Four times a day (QID) | ORAL | 0 refills | Status: DC | PRN

## 2021-02-23 MED ORDER — FOLIC ACID 1 MG PO TABS: 1.0000 mg | ORAL_TABLET | Freq: Every day | ORAL | 0 refills | Status: DC

## 2021-02-23 NOTE — Progress Notes (Signed)
Patient very forgetful and falling asleep during education, asked questions for understanding, IV and tele discontinued, when to return for worsening symptoms, Ativan was held this morning due to drowsiness.  1300 Spoke with MD regarding the patient being forgetful and having memory problems, okay to continue discharge at this time.

## 2021-02-23 NOTE — Discharge Instructions (Signed)
Flank Pain, Adult Flank pain is pain that is located on the side of the body between the upper abdomen and the back. This area is called the flank. The pain may occur over a short period of time (acute), or it may be [place]-term or recurring (chronic). It may be mild or severe. Flank pain can be caused by many things, including:  Muscle soreness or injury.  Kidney stones or kidney disease.  Stress.  A disease of the spine (vertebral disk disease).  A lung infection (pneumonia).  Fluid around the lungs (pulmonary edema).  A skin rash caused by the chickenpox virus (shingles).  Tumors that affect the back of the abdomen.  Gallbladder disease. Follow these instructions at home:  Drink enough fluid to keep your urine clear or pale yellow.  Rest as told by your health care provider.  Take over-the-counter and prescription medicines only as told by your health care provider.  Keep a journal to track what has caused your flank pain and what has made it feel better.  Keep all follow-up visits as told by your health care provider. This is important.   Contact a health care provider if:  Your pain is not controlled with medicine.  You have new symptoms.  Your pain gets worse.  You have a fever.  Your symptoms last longer than 2-3 days.  You have trouble urinating or you are urinating very frequently. Get help right away if:  You have trouble breathing or you are short of breath.  Your abdomen hurts or it is swollen or red.  You have nausea or vomiting.  You feel faint or you pass out.  You have blood in your urine. Summary  Flank pain is pain that is located on the side of the body between the upper abdomen and the back.  The pain may occur over a short period of time (acute), or it may be [place]-term or recurring (chronic). It may be mild or severe.  Flank pain can be caused by many things.  Contact your health care provider if your symptoms get worse or they last  longer than 2-3 days. This information is not intended to replace advice given to you by your health care provider. Make sure you discuss any questions you have with your health care provider. Document Revised: 07/02/2020 Document Reviewed: 07/02/2020 Elsevier Patient Education  2021 [employer]  

## 2021-02-23 NOTE — Discharge Summary (Signed)
Physician Discharge Summary  Isaac Jackson HQI:[id number] DOB: December 11, 1970 DOA: 02/21/2021  PCP: Pcp, No  Admit date: 02/21/2021 Discharge date: 02/23/2021  Admitted From: Home Disposition: Home  Recommendations for Outpatient Follow-up:  1. Follow up with PCP in 1 week with repeat CBC/CMP 2. Abstain from alcohol 3. Follow up in ED if symptoms worsen or new appear   Home Health: No Equipment/Devices: None  Discharge Condition: Stable CODE STATUS: Full Diet recommendation: Heart healthy  Brief/Interim Summary: 50 year old gentleman history of kidney stones first diagnosed at age 40, prior bowel obstruction, history of hernia repair, BPH, hypertension, chronic anxiety presented to [facility] ED secondary to worsening left flank pain radiating to his left groin, intermittent nausea and vomiting x3 days with poor oral intake.  CT stone protocol revealed bilateral nonobstructing kidney stones with acute kidney injury as well as concern for fecal retention.  He was treated with IV fluids and laxatives.  Urology was consulted with recommended conservative management.  His condition has much improved and renal function has improved.  He will be discharged home today with outpatient follow-up with PCP and urology if needed.  Discharge Diagnoses:   Bilateral nonobstructing renal calculi/renal colic -Presented with severe left flank plain rating to his left groin.  UA was negative for UTI.  CT renal stone protocol showed bilateral nonobstructing renal calculi.  Urology recommended conservative management.  Pain has resolved.  Discharge patient home today and might need outpatient urology follow-up. -Nausea and vomiting have resolved.  Acute renal failure -Treated with IV fluids.  Creatinine had worsened to 2.14.  Creatinine has much improved to 1.16 today.  Renal ultrasound was negative for hydronephrosis. -Outpatient follow-up. -Will keep losartan and hydrochlorothiazide on  hold.  Constipation -Continue bowel regimen upon discharge  Hypokalemia -Resolved  Mild hyponatremia -Resolved  Resolved chest pressure -EKG with no acute ischemic changes noted -High-sensitivity troponin negative -Currently chest pain-free. -No further work-up needed at this time.  Hypertension -Blood pressure on the lower side.  We will continue to hold antihypertensives on discharge  Hepatic steatosis on CAT scan -Alcohol abstinence.  Lifestyle modification  Alcohol use disorder -Counseled about alcohol abstinence.  Patient states that he is enrolled in [place] in [place].  Continue thiamine, folic acid and multivitamin  BPH -Continue Flomax   Discharge Instructions  Discharge Instructions    Diet - low sodium heart healthy   Complete by: As directed    Increase activity slowly   Complete by: As directed      Allergies as of 02/23/2021   No Known Allergies     Medication List    STOP taking these medications   ALPRAZolam 1 MG tablet Commonly known as: XANAX   chlordiazePOXIDE 5 MG capsule Commonly known as: LIBRIUM   hydrochlorothiazide 12.5 MG tablet Commonly known as: HYDRODIURIL   ketorolac 10 MG tablet Commonly known as: TORADOL   losartan 100 MG tablet Commonly known as: COZAAR   montelukast 10 MG tablet Commonly known as: SINGULAIR     TAKE these medications   albuterol 108 (90 Base) MCG/ACT inhaler Commonly known as: VENTOLIN HFA Inhale 1-2 puffs into the lungs every 6 (six) hours as needed.   folic acid 1 MG tablet Commonly known as: FOLVITE Take 1 tablet (1 mg total) by mouth daily. Start taking on: Feb 24, 2021   hydrOXYzine 25 MG capsule Commonly known as: VISTARIL Take 25 mg by mouth 3 (three) times daily as needed.   multivitamin with minerals Tabs tablet Take 1 tablet by  mouth daily. Start taking on: Feb 24, 2021   ondansetron 4 MG disintegrating tablet Commonly known as: ZOFRAN-ODT Take 1 tablet (4 mg total) by mouth  every 6 (six) hours as needed for nausea or vomiting. What changed:   how much to take  how to take this  when to take this  reasons to take this  additional instructions   polyethylene glycol 17 g packet Commonly known as: MIRALAX / GLYCOLAX Take 17 g by mouth 2 (two) times daily.   senna-docusate 8.6-50 MG tablet Commonly known as: Senokot-S Take 2 tablets by mouth 2 (two) times daily.   tamsulosin 0.4 MG Caps capsule Commonly known as: FLOMAX Take 0.4 mg by mouth daily.   thiamine 100 MG tablet Take 1 tablet (100 mg total) by mouth daily. Start taking on: Feb 24, 2021       Follow-up Information    PCP. Schedule an appointment as soon as possible for a visit in 1 week(s).   Why: with repeat CBC/CMP             No Known Allergies  Consultations:  Urology   Procedures/Studies: US RENAL  Result Date: 02/22/2021 CLINICAL DATA:  Acute renal failure EXAM: RENAL / URINARY TRACT ULTRASOUND COMPLETE COMPARISON:  CT abdomen 02/21/2021 FINDINGS: Right Kidney: Renal measurements: 10.5 x 5.2 x 4.6 cm = volume: 129 mL. Multiple right renal calculi the largest is 8 mm. Echogenicity within normal limits. No mass or hydronephrosis visualized. Left Kidney: Renal measurements: 9.9 x 5.1 x 4.7 cm = volume: 123 mL. Multiple left renal calculi. Largest stone 12.3 cm. Echogenicity within normal limits. No mass or hydronephrosis visualized. Bladder: Ureteral jets not identified.  No bladder mass. Other: None. IMPRESSION: Normal renal size. Multiple renal calculi bilaterally. No renal obstruction. Ureteral jets not visualized in the bladder. Electronically Signed   By: Marlan Palau M.D.   On: 02/22/2021 20:19   CT Renal Stone Study  Result Date: 02/21/2021 CLINICAL DATA:  Left-sided abdominal pain EXAM: CT ABDOMEN AND PELVIS WITHOUT CONTRAST TECHNIQUE: Multidetector CT imaging of the abdomen and pelvis was performed following the standard protocol without IV contrast. COMPARISON:   11/20/2020 FINDINGS: Lower chest: No acute pleural or parenchymal lung disease. Hepatobiliary: Diffuse hepatic steatosis without focal abnormality. No intrahepatic biliary duct dilation. Gallbladder is unremarkable. Pancreas: Unremarkable. No pancreatic ductal dilatation or surrounding inflammatory changes. Spleen: Normal in size without focal abnormality. Adrenals/Urinary Tract: There are numerous bilateral nonobstructing renal calculi without change in size or number since prior study. Largest calculus on the left measures up to 12 mm in the upper pole, and on the right measures up to 4 mm. No obstructive uropathy within either kidney. The adrenals and bladder are grossly normal. Stomach/Bowel: No bowel obstruction or ileus. Moderate retained stool throughout the colon. No bowel wall thickening or inflammatory change. Small hiatal hernia. Vascular/Lymphatic: No significant vascular findings are present. No enlarged abdominal or pelvic lymph nodes. Reproductive: Prostate is unremarkable. Other: No free fluid or free intraperitoneal gas. No evidence of abdominal wall hernia. Postsurgical changes are seen from bilateral inguinal hernia repairs. Musculoskeletal: No acute or destructive bony lesions. Reconstructed images demonstrate no additional findings. IMPRESSION: 1. Bilateral nonobstructing renal calculi without change in size or number. 2. Hepatic steatosis. 3. Small hiatal hernia. 4. Moderate fecal retention. Electronically Signed   By: Sharlet Salina M.D.   On: 02/21/2021 17:09       Subjective: Patient seen and examined at bedside.  Feels much better and wants  to go home today.  No overnight fever, vomiting, flank pain reported.  Discharge Exam: Vitals:   02/23/21 0628 02/23/21 0907  BP: 100/65 107/72  Pulse: 86 70  Resp: 18 20  Temp: 98.8 F (37.1 C) 99.4 F (37.4 C)  SpO2: 91% 93%    General: Pt is alert, awake, not in acute distress Cardiovascular: rate controlled, S1/S2  + Respiratory: bilateral decreased breath sounds at bases Abdominal: Soft, NT, ND, bowel sounds + Extremities: no edema, no cyanosis    The results of significant diagnostics from this hospitalization (including imaging, microbiology, ancillary and laboratory) are listed below for reference.     Microbiology: Recent Results (from the past 240 hour(s))  SARS CORONAVIRUS 2 (TAT 6-24 HRS) Nasopharyngeal Nasopharyngeal Swab     Status: None   Collection Time: 02/21/21  8:47 PM   Specimen: Nasopharyngeal Swab  Result Value Ref Range Status   SARS Coronavirus 2 NEGATIVE NEGATIVE Final    Comment: (NOTE) SARS-CoV-2 target nucleic acids are NOT DETECTED.  The SARS-CoV-2 RNA is generally detectable in upper and lower respiratory specimens during the acute phase of infection. Negative results do not preclude SARS-CoV-2 infection, do not rule out co-infections with other pathogens, and should not be used as the sole basis for treatment or other patient management decisions. Negative results must be combined with clinical observations, patient history, and epidemiological information. The expected result is Negative.  Fact Sheet for Patients: HairSlick.no  Fact Sheet for Healthcare Providers: quierodirigir.com  This test is not yet approved or cleared by the [place] FDA and  has been authorized for detection and/or diagnosis of SARS-CoV-2 by FDA under an Emergency Use Authorization (EUA). This EUA will remain  in effect (meaning this test can be used) for the duration of the COVID-19 declaration under Se ction 564(b)(1) of the Act, 21 U.S.C. section 360bbb-3(b)(1), unless the authorization is terminated or revoked sooner.  Performed at [facility], [address], [place], [place] [address]      Labs: BNP (last 3 results) No results for input(s): BNP in the last 8760 hours. Basic Metabolic Panel: Recent Labs   Lab 02/21/21 1345 02/22/21 0407 02/22/21 1043 02/23/21 0546  NA 132* 134*  --  135  K 3.1* 3.6  --  4.2  CL 93* 96*  --  104  CO2 23 28  --  25  GLUCOSE 108* 103*  --  74  BUN 23* 32*  --  22*  CREATININE 1.47* 2.14*  --  1.16  CALCIUM 9.1 8.6*  --  8.2*  MG  --  2.1  --  1.8  PHOS  --  3.9 3.2  --    Liver Function Tests: Recent Labs  Lab 02/21/21 1345 02/22/21 0407 02/23/21 0546  AST 40 35 32  ALT 41 34 30  ALKPHOS 56 48 50  BILITOT 1.6* 1.7* 1.0  PROT 7.9 6.6 5.6*  ALBUMIN 4.4 4.0 3.3*   Recent Labs  Lab 02/21/21 1345  LIPASE 36   No results for input(s): AMMONIA in the last 168 hours. CBC: Recent Labs  Lab 02/21/21 1345 02/22/21 0407 02/23/21 0546  WBC 9.8 8.8 11.5*  NEUTROABS  --   --  7.9*  HGB 17.0 14.1 12.9*  HCT 48.0 41.1 37.4*  MCV 89.6 92.8 93.0  PLT 223 170 132*   Cardiac Enzymes: No results for input(s): CKTOTAL, CKMB, CKMBINDEX, TROPONINI in the last 168 hours. BNP: Invalid input(s): POCBNP CBG: No results for input(s):  GLUCAP in the last 168 hours. D-Dimer No results for input(s): DDIMER in the last 72 hours. Hgb A1c No results for input(s): HGBA1C in the last 72 hours. Lipid Profile No results for input(s): CHOL, HDL, LDLCALC, TRIG, CHOLHDL, LDLDIRECT in the last 72 hours. Thyroid function studies No results for input(s): TSH, T4TOTAL, T3FREE, THYROIDAB in the last 72 hours.  Invalid input(s): FREET3 Anemia work up No results for input(s): VITAMINB12, FOLATE, FERRITIN, TIBC, IRON, RETICCTPCT in the last 72 hours. Urinalysis    Component Value Date/Time   COLORURINE YELLOW (A) 02/21/2021 1345   APPEARANCEUR CLEAR (A) 02/21/2021 1345   LABSPEC 1.005 02/21/2021 1345   PHURINE 7.0 02/21/2021 1345   GLUCOSEU NEGATIVE 02/21/2021 1345   HGBUR MODERATE (A) 02/21/2021 1345   BILIRUBINUR NEGATIVE 02/21/2021 1345   KETONESUR NEGATIVE 02/21/2021 1345   PROTEINUR NEGATIVE 02/21/2021 1345   NITRITE NEGATIVE 02/21/2021 1345    LEUKOCYTESUR NEGATIVE 02/21/2021 1345   Sepsis Labs Invalid input(s): PROCALCITONIN,  WBC,  LACTICIDVEN Microbiology Recent Results (from the past 240 hour(s))  SARS CORONAVIRUS 2 (TAT 6-24 HRS) Nasopharyngeal Nasopharyngeal Swab     Status: None   Collection Time: 02/21/21  8:47 PM   Specimen: Nasopharyngeal Swab  Result Value Ref Range Status   SARS Coronavirus 2 NEGATIVE NEGATIVE Final    Comment: (NOTE) SARS-CoV-2 target nucleic acids are NOT DETECTED.  The SARS-CoV-2 RNA is generally detectable in upper and lower respiratory specimens during the acute phase of infection. Negative results do not preclude SARS-CoV-2 infection, do not rule out co-infections with other pathogens, and should not be used as the sole basis for treatment or other patient management decisions. Negative results must be combined with clinical observations, patient history, and epidemiological information. The expected result is Negative.  Fact Sheet for Patients: HairSlick.no  Fact Sheet for Healthcare Providers: quierodirigir.com  This test is not yet approved or cleared by the [place] FDA and  has been authorized for detection and/or diagnosis of SARS-CoV-2 by FDA under an Emergency Use Authorization (EUA). This EUA will remain  in effect (meaning this test can be used) for the duration of the COVID-19 declaration under Se ction 564(b)(1) of the Act, 21 U.S.C. section 360bbb-3(b)(1), unless the authorization is terminated or revoked sooner.  Performed at [facility], [address] [place], [place] [address]      Time coordinating discharge: 35 minutes  SIGNED:   Glade Lloyd, MD  [facility] 02/23/2021, 9:32 AM

## 2021-02-23 NOTE — Progress Notes (Signed)
Secure chat to Dr. Hanley Ben at this time: Hey, I am charge on the unit today. Is this patient mentally stable enough to discharge? He is looking for his keys and didn't think to call his hotel and see if they were there. Then he doesn't know how to get an uber or lyft to leave. I was going to show him how to do it on his phone, he has attempted his phone password so many times he has locked his phone. I did notice he was saying the letter B as he was typing but he typed it first as a G and then as a D. He just seems confused.  Return chat that patient could stay longer to see if mental status improved. Patient refused to stay, he was taken to front door via wheelchair and had volunteers call a cab, cab was going to take approximately an hour, patient said that was too [place] and left on foot.

## 2021-03-18 ENCOUNTER — Emergency Department: Payer: 59

## 2021-03-18 ENCOUNTER — Inpatient Hospital Stay
Admission: EM | Admit: 2021-03-18 | Discharge: 2021-03-22 | DRG: 897 | Disposition: A | Discharge status: Home / Self Care | Payer: 59 | Attending: Obstetrics and Gynecology | Admitting: Obstetrics and Gynecology

## 2021-03-18 ENCOUNTER — Encounter: Payer: Self-pay | Admitting: Emergency Medicine

## 2021-03-18 ENCOUNTER — Other Ambulatory Visit: Payer: Self-pay

## 2021-03-18 DIAGNOSIS — J45909 Unspecified asthma, uncomplicated: Secondary | ICD-10-CM | POA: Diagnosis present

## 2021-03-18 DIAGNOSIS — R569 Unspecified convulsions: Secondary | ICD-10-CM | POA: Diagnosis present

## 2021-03-18 DIAGNOSIS — K828 Other specified diseases of gallbladder: Secondary | ICD-10-CM | POA: Diagnosis present

## 2021-03-18 DIAGNOSIS — Z20822 Contact with and (suspected) exposure to covid-19: Secondary | ICD-10-CM | POA: Diagnosis present

## 2021-03-18 DIAGNOSIS — L03115 Cellulitis of right lower limb: Secondary | ICD-10-CM

## 2021-03-18 DIAGNOSIS — F10939 Alcohol use, unspecified with withdrawal, unspecified: Secondary | ICD-10-CM | POA: Diagnosis present

## 2021-03-18 DIAGNOSIS — F1020 Alcohol dependence, uncomplicated: Secondary | ICD-10-CM | POA: Diagnosis present

## 2021-03-18 DIAGNOSIS — Z87442 Personal history of urinary calculi: Secondary | ICD-10-CM

## 2021-03-18 DIAGNOSIS — L299 Pruritus, unspecified: Secondary | ICD-10-CM | POA: Diagnosis present

## 2021-03-18 DIAGNOSIS — Z6823 Body mass index (BMI) 23.0-23.9, adult: Secondary | ICD-10-CM

## 2021-03-18 DIAGNOSIS — Z811 Family history of alcohol abuse and dependence: Secondary | ICD-10-CM

## 2021-03-18 DIAGNOSIS — Z66 Do not resuscitate: Secondary | ICD-10-CM | POA: Diagnosis present

## 2021-03-18 DIAGNOSIS — F10231 Alcohol dependence with withdrawal delirium: Secondary | ICD-10-CM

## 2021-03-18 DIAGNOSIS — N400 Enlarged prostate without lower urinary tract symptoms: Secondary | ICD-10-CM | POA: Diagnosis present

## 2021-03-18 DIAGNOSIS — F10220 Alcohol dependence with intoxication, uncomplicated: Secondary | ICD-10-CM

## 2021-03-18 DIAGNOSIS — E875 Hyperkalemia: Secondary | ICD-10-CM | POA: Diagnosis not present

## 2021-03-18 DIAGNOSIS — E440 Moderate protein-calorie malnutrition: Secondary | ICD-10-CM | POA: Insufficient documentation

## 2021-03-18 DIAGNOSIS — Z9049 Acquired absence of other specified parts of digestive tract: Secondary | ICD-10-CM

## 2021-03-18 DIAGNOSIS — R1011 Right upper quadrant pain: Secondary | ICD-10-CM

## 2021-03-18 DIAGNOSIS — F10929 Alcohol use, unspecified with intoxication, unspecified: Secondary | ICD-10-CM | POA: Diagnosis not present

## 2021-03-18 DIAGNOSIS — I10 Essential (primary) hypertension: Secondary | ICD-10-CM | POA: Diagnosis present

## 2021-03-18 DIAGNOSIS — F419 Anxiety disorder, unspecified: Secondary | ICD-10-CM | POA: Diagnosis present

## 2021-03-18 DIAGNOSIS — F32A Depression, unspecified: Secondary | ICD-10-CM | POA: Diagnosis present

## 2021-03-18 DIAGNOSIS — W050XXA Fall from non-moving wheelchair, initial encounter: Secondary | ICD-10-CM | POA: Diagnosis present

## 2021-03-18 DIAGNOSIS — Y906 Blood alcohol level of 120-199 mg/100 ml: Secondary | ICD-10-CM | POA: Diagnosis present

## 2021-03-18 DIAGNOSIS — F10239 Alcohol dependence with withdrawal, unspecified: Secondary | ICD-10-CM | POA: Diagnosis present

## 2021-03-18 DIAGNOSIS — R112 Nausea with vomiting, unspecified: Secondary | ICD-10-CM | POA: Diagnosis not present

## 2021-03-18 DIAGNOSIS — F17210 Nicotine dependence, cigarettes, uncomplicated: Secondary | ICD-10-CM | POA: Diagnosis present

## 2021-03-18 DIAGNOSIS — K760 Fatty (change of) liver, not elsewhere classified: Secondary | ICD-10-CM | POA: Diagnosis present

## 2021-03-18 DIAGNOSIS — E876 Hypokalemia: Secondary | ICD-10-CM | POA: Diagnosis present

## 2021-03-18 DIAGNOSIS — Z79899 Other long term (current) drug therapy: Secondary | ICD-10-CM

## 2021-03-18 LAB — MAGNESIUM: Magnesium: 2.1 mg/dL (ref 1.7–2.4)

## 2021-03-18 LAB — RESP PANEL BY RT-PCR (FLU A&B, COVID) ARPGX2
Influenza A by PCR: NEGATIVE
Influenza B by PCR: NEGATIVE
SARS Coronavirus 2 by RT PCR: NEGATIVE

## 2021-03-18 LAB — HEPATIC FUNCTION PANEL
ALT: 20 U/L (ref 0–44)
AST: 24 U/L (ref 15–41)
Albumin: 4.7 g/dL (ref 3.5–5.0)
Alkaline Phosphatase: 63 U/L (ref 38–126)
Bilirubin, Direct: 0.2 mg/dL (ref 0.0–0.2)
Indirect Bilirubin: 0.7 mg/dL (ref 0.3–0.9)
Total Bilirubin: 0.9 mg/dL (ref 0.3–1.2)
Total Protein: 8.1 g/dL (ref 6.5–8.1)

## 2021-03-18 LAB — BASIC METABOLIC PANEL
Anion gap: 15 (ref 5–15)
BUN: 12 mg/dL (ref 6–20)
CO2: 26 mmol/L (ref 22–32)
Calcium: 9.5 mg/dL (ref 8.9–10.3)
Chloride: 95 mmol/L — ABNORMAL LOW (ref 98–111)
Creatinine, Ser: 1.05 mg/dL (ref 0.61–1.24)
GFR, Estimated: >60 mL/min (ref >60)
Glucose, Bld: 111 mg/dL — ABNORMAL HIGH (ref 70–99)
Potassium: 3.4 mmol/L — ABNORMAL LOW (ref 3.5–5.1)
Sodium: 136 mmol/L (ref 135–145)

## 2021-03-18 LAB — ETHANOL: Alcohol, Ethyl (B): 165 mg/dL — ABNORMAL HIGH (ref <10)

## 2021-03-18 LAB — CBC
HCT: 50.5 % (ref 39.0–52.0)
Hemoglobin: 17.9 g/dL — ABNORMAL HIGH (ref 13.0–17.0)
MCH: 32.3 pg (ref 26.0–34.0)
MCHC: 35.4 g/dL (ref 30.0–36.0)
MCV: 91 fL (ref 80.0–100.0)
Platelets: 236 10*3/uL (ref 150–400)
RBC: 5.55 MIL/uL (ref 4.22–5.81)
RDW: 14.3 % (ref 11.5–15.5)
WBC: 8.4 10*3/uL (ref 4.0–10.5)
nRBC: 0 % (ref 0.0–0.2)

## 2021-03-18 LAB — LIPASE, BLOOD: Lipase: 30 U/L (ref 11–51)

## 2021-03-18 MED ORDER — SODIUM CHLORIDE 0.9 % IV SOLN
100.0000 mg | Freq: Once | INTRAVENOUS | Status: AC
  Administered 2021-03-18: 100 mg via INTRAVENOUS
  Filled 2021-03-18: qty 100

## 2021-03-18 MED ORDER — CHLORDIAZEPOXIDE HCL 25 MG PO CAPS
25.0000 mg | ORAL_CAPSULE | ORAL | Status: AC
  Administered 2021-03-22: 25 mg via ORAL
  Filled 2021-03-18 (×2): qty 1

## 2021-03-18 MED ORDER — SODIUM CHLORIDE 0.9% FLUSH
3.0000 mL | Freq: Two times a day (BID) | INTRAVENOUS | Status: DC
  Administered 2021-03-18 – 2021-03-22 (×8): 3 mL via INTRAVENOUS

## 2021-03-18 MED ORDER — THIAMINE HCL 100 MG PO TABS: 100.0000 mg | ORAL_TABLET | Freq: Every day | ORAL | Status: DC

## 2021-03-18 MED ORDER — LACTATED RINGERS IV BOLUS
1000.0000 mL | Freq: Once | INTRAVENOUS | Status: AC
  Administered 2021-03-18: 1000 mL via INTRAVENOUS

## 2021-03-18 MED ORDER — THIAMINE HCL 100 MG PO TABS
100.0000 mg | ORAL_TABLET | Freq: Every day | ORAL | Status: DC
  Administered 2021-03-19 – 2021-03-22 (×4): 100 mg via ORAL
  Filled 2021-03-18 (×4): qty 1

## 2021-03-18 MED ORDER — ACETAMINOPHEN 500 MG PO TABS
1000.0000 mg | ORAL_TABLET | Freq: Once | ORAL | Status: AC
  Administered 2021-03-18: 1000 mg via ORAL
  Filled 2021-03-18: qty 2

## 2021-03-18 MED ORDER — ADULT MULTIVITAMIN W/MINERALS CH
1.0000 | ORAL_TABLET | Freq: Every day | ORAL | Status: DC
  Administered 2021-03-19 – 2021-03-22 (×4): 1 via ORAL
  Filled 2021-03-18 (×4): qty 1

## 2021-03-18 MED ORDER — CHLORDIAZEPOXIDE HCL 25 MG PO CAPS
25.0000 mg | ORAL_CAPSULE | Freq: Three times a day (TID) | ORAL | Status: AC
  Administered 2021-03-20 – 2021-03-21 (×3): 25 mg via ORAL
  Filled 2021-03-18 (×3): qty 1

## 2021-03-18 MED ORDER — PANTOPRAZOLE SODIUM 40 MG IV SOLR
40.0000 mg | Freq: Once | INTRAVENOUS | Status: AC
  Administered 2021-03-18: 40 mg via INTRAVENOUS
  Filled 2021-03-18: qty 40

## 2021-03-18 MED ORDER — SODIUM CHLORIDE 0.9 % IV SOLN
12.5000 mg | Freq: Once | INTRAVENOUS | Status: AC
  Administered 2021-03-18: 12.5 mg via INTRAVENOUS
  Filled 2021-03-18: qty 0.5

## 2021-03-18 MED ORDER — ACETAMINOPHEN 325 MG PO TABS
650.0000 mg | ORAL_TABLET | Freq: Four times a day (QID) | ORAL | Status: DC | PRN
  Administered 2021-03-20: 650 mg via ORAL
  Filled 2021-03-18: qty 2

## 2021-03-18 MED ORDER — CHLORDIAZEPOXIDE HCL 25 MG PO CAPS
25.0000 mg | ORAL_CAPSULE | Freq: Four times a day (QID) | ORAL | Status: AC | PRN
  Administered 2021-03-20 – 2021-03-21 (×2): 25 mg via ORAL
  Filled 2021-03-18 (×2): qty 1

## 2021-03-18 MED ORDER — CHLORDIAZEPOXIDE HCL 25 MG PO CAPS
25.0000 mg | ORAL_CAPSULE | Freq: Four times a day (QID) | ORAL | Status: AC
  Administered 2021-03-18 – 2021-03-20 (×5): 25 mg via ORAL
  Filled 2021-03-18 (×5): qty 1

## 2021-03-18 MED ORDER — THIAMINE HCL 100 MG/ML IJ SOLN
100.0000 mg | Freq: Once | INTRAMUSCULAR | Status: AC
  Administered 2021-03-18: 100 mg via INTRAVENOUS
  Filled 2021-03-18: qty 2

## 2021-03-18 MED ORDER — HALOPERIDOL LACTATE 5 MG/ML IJ SOLN
2.5000 mg | Freq: Once | INTRAMUSCULAR | Status: AC
  Administered 2021-03-18: 2.5 mg via INTRAVENOUS
  Filled 2021-03-18: qty 1

## 2021-03-18 MED ORDER — ONDANSETRON HCL 4 MG/2ML IJ SOLN
4.0000 mg | Freq: Four times a day (QID) | INTRAMUSCULAR | Status: DC | PRN
  Administered 2021-03-19 – 2021-03-21 (×5): 4 mg via INTRAVENOUS
  Filled 2021-03-18 (×5): qty 2

## 2021-03-18 MED ORDER — LORAZEPAM 1 MG PO TABS
1.0000 mg | ORAL_TABLET | ORAL | Status: AC | PRN
  Administered 2021-03-20 (×2): 1 mg via ORAL
  Filled 2021-03-18 (×3): qty 1

## 2021-03-18 MED ORDER — LORAZEPAM 1 MG PO TABS: 1.0000 mg | ORAL_TABLET | Freq: Every day | ORAL | Status: DC

## 2021-03-18 MED ORDER — HYDROXYZINE HCL 25 MG PO TABS
25.0000 mg | ORAL_TABLET | Freq: Four times a day (QID) | ORAL | Status: DC | PRN
  Filled 2021-03-18: qty 1

## 2021-03-18 MED ORDER — ONDANSETRON HCL 4 MG PO TABS
4.0000 mg | ORAL_TABLET | Freq: Four times a day (QID) | ORAL | Status: DC | PRN
  Administered 2021-03-22: 4 mg via ORAL
  Filled 2021-03-18: qty 1

## 2021-03-18 MED ORDER — LORAZEPAM 1 MG PO TABS
1.0000 mg | ORAL_TABLET | Freq: Four times a day (QID) | ORAL | Status: DC | PRN
  Administered 2021-03-18: 1 mg via ORAL
  Filled 2021-03-18: qty 1

## 2021-03-18 MED ORDER — SENNOSIDES-DOCUSATE SODIUM 8.6-50 MG PO TABS
2.0000 | ORAL_TABLET | Freq: Two times a day (BID) | ORAL | Status: DC
  Administered 2021-03-18: 2 via ORAL
  Filled 2021-03-18: qty 2

## 2021-03-18 MED ORDER — LORAZEPAM 2 MG/ML IJ SOLN
2.0000 mg | Freq: Once | INTRAMUSCULAR | Status: AC
  Administered 2021-03-18: 2 mg via INTRAVENOUS
  Filled 2021-03-18: qty 1

## 2021-03-18 MED ORDER — NICOTINE 14 MG/24HR TD PT24
14.0000 mg | MEDICATED_PATCH | Freq: Every day | TRANSDERMAL | Status: DC
  Administered 2021-03-18 – 2021-03-22 (×5): 14 mg via TRANSDERMAL
  Filled 2021-03-18 (×5): qty 1

## 2021-03-18 MED ORDER — LOPERAMIDE HCL 2 MG PO CAPS: 2.0000 mg | ORAL_CAPSULE | ORAL | Status: AC | PRN

## 2021-03-18 MED ORDER — ALBUTEROL SULFATE HFA 108 (90 BASE) MCG/ACT IN AERS
1.0000 | INHALATION_SPRAY | Freq: Four times a day (QID) | RESPIRATORY_TRACT | Status: DC | PRN
  Filled 2021-03-18: qty 6.7

## 2021-03-18 MED ORDER — ONDANSETRON 4 MG PO TBDP
4.0000 mg | ORAL_TABLET | Freq: Four times a day (QID) | ORAL | Status: DC | PRN
  Administered 2021-03-18: 4 mg via ORAL
  Filled 2021-03-18: qty 1

## 2021-03-18 MED ORDER — ACETAMINOPHEN 650 MG RE SUPP: 650.0000 mg | Freq: Four times a day (QID) | RECTAL | Status: DC | PRN

## 2021-03-18 MED ORDER — DOXYCYCLINE HYCLATE 100 MG PO TABS
100.0000 mg | ORAL_TABLET | Freq: Two times a day (BID) | ORAL | Status: DC
  Administered 2021-03-19 – 2021-03-22 (×7): 100 mg via ORAL
  Filled 2021-03-18 (×7): qty 1

## 2021-03-18 MED ORDER — SODIUM CHLORIDE 0.9 % IV BOLUS
1000.0000 mL | Freq: Once | INTRAVENOUS | Status: AC
  Administered 2021-03-18: 1000 mL via INTRAVENOUS

## 2021-03-18 MED ORDER — LORAZEPAM 1 MG PO TABS
1.0000 mg | ORAL_TABLET | Freq: Four times a day (QID) | ORAL | Status: DC
  Administered 2021-03-18 (×2): 1 mg via ORAL
  Filled 2021-03-18 (×2): qty 1

## 2021-03-18 MED ORDER — ALUM & MAG HYDROXIDE-SIMETH 200-200-20 MG/5ML PO SUSP: 30.0000 mL | Freq: Four times a day (QID) | ORAL | Status: DC | PRN

## 2021-03-18 MED ORDER — ENOXAPARIN SODIUM 40 MG/0.4ML IJ SOSY
40.0000 mg | PREFILLED_SYRINGE | INTRAMUSCULAR | Status: DC
  Administered 2021-03-18 – 2021-03-21 (×4): 40 mg via SUBCUTANEOUS
  Filled 2021-03-18 (×4): qty 0.4

## 2021-03-18 MED ORDER — LORAZEPAM 1 MG PO TABS: 1.0000 mg | ORAL_TABLET | Freq: Three times a day (TID) | ORAL | Status: DC

## 2021-03-18 MED ORDER — TAMSULOSIN HCL 0.4 MG PO CAPS
0.4000 mg | ORAL_CAPSULE | Freq: Every day | ORAL | Status: DC
  Administered 2021-03-18 – 2021-03-22 (×5): 0.4 mg via ORAL
  Filled 2021-03-18 (×5): qty 1

## 2021-03-18 MED ORDER — LORAZEPAM 2 MG/ML IJ SOLN
1.0000 mg | Freq: Once | INTRAMUSCULAR | Status: AC
  Administered 2021-03-18: 1 mg via INTRAVENOUS
  Filled 2021-03-18: qty 1

## 2021-03-18 MED ORDER — ALUM & MAG HYDROXIDE-SIMETH 200-200-20 MG/5ML PO SUSP
30.0000 mL | Freq: Once | ORAL | Status: DC
  Filled 2021-03-18: qty 30

## 2021-03-18 MED ORDER — HYDROXYZINE HCL 25 MG PO TABS
25.0000 mg | ORAL_TABLET | Freq: Four times a day (QID) | ORAL | Status: DC | PRN
  Administered 2021-03-20 (×2): 25 mg via ORAL
  Filled 2021-03-18 (×4): qty 1

## 2021-03-18 MED ORDER — LIDOCAINE VISCOUS HCL 2 % MT SOLN
15.0000 mL | Freq: Once | OROMUCOSAL | Status: DC
  Filled 2021-03-18: qty 15

## 2021-03-18 MED ORDER — FOLIC ACID 1 MG PO TABS
1.0000 mg | ORAL_TABLET | Freq: Every day | ORAL | Status: DC
  Administered 2021-03-18: 1 mg via ORAL
  Filled 2021-03-18: qty 1

## 2021-03-18 MED ORDER — LORAZEPAM 2 MG/ML IJ SOLN
1.0000 mg | INTRAMUSCULAR | Status: AC | PRN
  Administered 2021-03-19 (×3): 1 mg via INTRAVENOUS
  Administered 2021-03-19: 4 mg via INTRAVENOUS
  Administered 2021-03-20 – 2021-03-21 (×5): 2 mg via INTRAVENOUS
  Administered 2021-03-21: 3 mg via INTRAVENOUS
  Filled 2021-03-18: qty 1
  Filled 2021-03-18 (×2): qty 2
  Filled 2021-03-18 (×8): qty 1

## 2021-03-18 MED ORDER — SENNOSIDES-DOCUSATE SODIUM 8.6-50 MG PO TABS: 2.0000 | ORAL_TABLET | Freq: Every evening | ORAL | Status: DC | PRN

## 2021-03-18 MED ORDER — POLYETHYLENE GLYCOL 3350 17 G PO PACK
17.0000 g | PACK | Freq: Two times a day (BID) | ORAL | Status: DC
  Administered 2021-03-18: 17 g via ORAL
  Filled 2021-03-18: qty 1

## 2021-03-18 MED ORDER — ADULT MULTIVITAMIN W/MINERALS CH
1.0000 | ORAL_TABLET | Freq: Every day | ORAL | Status: DC
  Administered 2021-03-18: 1 via ORAL
  Filled 2021-03-18: qty 1

## 2021-03-18 MED ORDER — THIAMINE HCL 100 MG/ML IJ SOLN: 100.0000 mg | Freq: Every day | INTRAMUSCULAR | Status: DC

## 2021-03-18 MED ORDER — LORAZEPAM 1 MG PO TABS: 1.0000 mg | ORAL_TABLET | Freq: Two times a day (BID) | ORAL | Status: DC

## 2021-03-18 MED ORDER — FOLIC ACID 1 MG PO TABS
1.0000 mg | ORAL_TABLET | Freq: Every day | ORAL | Status: DC
  Administered 2021-03-19 – 2021-03-22 (×4): 1 mg via ORAL
  Filled 2021-03-18 (×4): qty 1

## 2021-03-18 MED ORDER — ONDANSETRON HCL 4 MG/2ML IJ SOLN
4.0000 mg | Freq: Once | INTRAMUSCULAR | Status: AC
  Administered 2021-03-18: 4 mg via INTRAVENOUS
  Filled 2021-03-18: qty 2

## 2021-03-18 MED ORDER — POTASSIUM CHLORIDE 2 MEQ/ML IV SOLN
INTRAVENOUS | Status: DC
  Filled 2021-03-18 (×2): qty 1000

## 2021-03-18 MED ORDER — CHLORDIAZEPOXIDE HCL 25 MG PO CAPS: 25.0000 mg | ORAL_CAPSULE | Freq: Every day | ORAL | Status: DC

## 2021-03-18 MED ORDER — THIAMINE HCL 100 MG/ML IJ SOLN
100.0000 mg | Freq: Once | INTRAMUSCULAR | Status: DC
  Filled 2021-03-18: qty 2

## 2021-03-18 NOTE — ED Notes (Signed)
Dr. Erma Heritage notified patient CIWA score 18. Awaiting orders.

## 2021-03-18 NOTE — ED Triage Notes (Signed)
Pt comes into the ED via EMS from [facility], has had N/V/D "seizure like activity" while talking with staff, noted from hotel staff pt has been drinking a lot . Pt states he fell in the elevator, no visible wounds or deformity noted at this time.Marland Kitchen

## 2021-03-18 NOTE — ED Notes (Signed)
Ultrasound at bedside

## 2021-03-18 NOTE — ED Provider Notes (Signed)
[facility] Emergency Department Provider Note  ____________________________________________   Event Date/Time   First MD Initiated Contact with Patient 03/18/21 1512     (approximate)  I have reviewed the triage vital signs and the nursing notes.   HISTORY  Chief Complaint Seizures and Fall    HPI Isaac Jackson is a 49 y.o. male  Here with seizure like activity. Pt reports that he has had aching, gnawing epigastric pain x 3-4 days, along with nausea, vomiting. He has been unable to eat or drink much. He has also recently relapsed on EtOH. Has been unable to keep anything down.  Earlier today, he felt very weak.  He went to get into the elevator at the hotel he was staying at and reportedly that his last thing he remembers.  Per EMS report, patient had seizure-like activity per report from the hotel staff.  They have noted that the patient has been drinking fairly heavily recently.  He states he has had seizure in the setting of alcohol withdrawal in the past.  He currently complains of a mild headache after his seizure.  This not unusual.  Denies any focal numbness or weakness.  No chest pain.  He states that his abdominal pain is worse with any attempted eating or drinking.  No alleviating factors.       Past Medical History:  Diagnosis Date  . Asthma   . History of alcohol abuse    patient reports that he is an alcoholic and goes to AA  . Hypertension   . Kidney stone   . Renal disorder     Patient Active Problem List   Diagnosis Date Noted  . Bilateral nephrolithiasis 02/22/2021  . Acute renal failure (ARF) (HCC) 02/22/2021  . Chest pain 02/22/2021  . Hyponatremia 02/22/2021  . Elevated bilirubin 02/22/2021  . HTN (hypertension) 02/22/2021  . BPH (benign prostatic hyperplasia) 02/22/2021  . Anxiety 02/22/2021  . Kidney stones 02/21/2021    Past Surgical History:  Procedure Laterality Date  . ABDOMINAL SURGERY    . APPENDECTOMY    .  HERNIA REPAIR      Prior to Admission medications   Medication Sig Start Date End Date Taking? Authorizing Provider  albuterol (VENTOLIN HFA) 108 (90 Base) MCG/ACT inhaler Inhale 1-2 puffs into the lungs every 6 (six) hours as needed. 10/23/19   [provider]  folic acid (FOLVITE) 1 MG tablet Take 1 tablet (1 mg total) by mouth daily. 02/24/21   Glade Lloyd, MD  hydrOXYzine (VISTARIL) 25 MG capsule Take 25 mg by mouth 3 (three) times daily as needed. 12/01/20   [provider]  Multiple Vitamin (MULTIVITAMIN WITH MINERALS) TABS tablet Take 1 tablet by mouth daily. 02/24/21   Glade Lloyd, MD  ondansetron (ZOFRAN-ODT) 4 MG disintegrating tablet Take 1 tablet (4 mg total) by mouth every 6 (six) hours as needed for nausea or vomiting. 02/23/21   Glade Lloyd, MD  polyethylene glycol (MIRALAX / GLYCOLAX) 17 g packet Take 17 g by mouth 2 (two) times daily. 02/23/21   Glade Lloyd, MD  senna-docusate (SENOKOT-S) 8.6-50 MG tablet Take 2 tablets by mouth 2 (two) times daily. 02/23/21   Glade Lloyd, MD  tamsulosin (FLOMAX) 0.4 MG CAPS capsule Take 0.4 mg by mouth daily. 02/06/21   [provider]  thiamine 100 MG tablet Take 1 tablet (100 mg total) by mouth daily. 02/24/21   Glade Lloyd, MD    Allergies Patient has no known allergies.  History  reviewed. No pertinent family history.  Social History Social History   Tobacco Use  . Smoking status: Current Some Day Smoker    Types: Cigarettes  . Smokeless tobacco: Never Used  Vaping Use  . Vaping Use: Never used  Substance Use Topics  . Alcohol use: Yes    Comment: daily  . Drug use: Never    Review of Systems  Review of Systems  Constitutional: Positive for fatigue. Negative for chills and fever.  HENT: Negative for sore throat.   Respiratory: Negative for shortness of breath.   Cardiovascular: Negative for chest pain.  Gastrointestinal: Positive for nausea and vomiting. Negative for abdominal pain.   Genitourinary: Negative for flank pain.  Musculoskeletal: Negative for neck pain.  Skin: Negative for rash and wound.  Allergic/Immunologic: Negative for immunocompromised state.  Neurological: Positive for seizures and weakness. Negative for numbness.  Hematological: Does not bruise/bleed easily.     ____________________________________________  PHYSICAL EXAM:      VITAL SIGNS: ED Triage Vitals  Enc Vitals Group     BP 03/18/21 1414 (!) 136/100     Pulse Rate 03/18/21 1414 (!) 113     Resp 03/18/21 1414 18     Temp 03/18/21 1414 98.1 F (36.7 C)     Temp Source 03/18/21 1414 Oral     SpO2 03/18/21 1414 98 %     Weight 03/18/21 1415 140 lb (63.5 kg)     Height 03/18/21 1415 5\' 7"  (1.702 m)     Head Circumference --      Peak Flow --      Pain Score 03/18/21 1415 4     Pain Loc --      Pain Edu? --      Excl. in GC? --      Physical Exam Vitals and nursing note reviewed.  Constitutional:      General: He is not in acute distress.    Appearance: He is well-developed.  HENT:     Head: Normocephalic and atraumatic.     Mouth/Throat:     Mouth: Mucous membranes are dry.  Eyes:     Conjunctiva/sclera: Conjunctivae normal.  Cardiovascular:     Rate and Rhythm: Normal rate and regular rhythm.     Heart sounds: Normal heart sounds. No murmur heard. No friction rub.  Pulmonary:     Effort: Pulmonary effort is normal. No respiratory distress.     Breath sounds: Normal breath sounds. No wheezing or rales.  Abdominal:     General: There is no distension.     Palpations: Abdomen is soft.     Tenderness: There is abdominal tenderness (Moderate, epigastric).  Musculoskeletal:     Cervical back: Neck supple.  Skin:    General: Skin is warm.     Capillary Refill: Capillary refill takes less than 2 seconds.  Neurological:     Mental Status: He is alert and oriented to person, place, and time.     Motor: No abnormal muscle tone.        ____________________________________________   LABS (all labs ordered are listed, but only abnormal results are displayed)  Labs Reviewed  BASIC METABOLIC PANEL - Abnormal; Notable for the following components:      Result Value   Potassium 3.4 (*)    Chloride 95 (*)    Glucose, Bld 111 (*)    All other components within normal limits  CBC - Abnormal; Notable for the following components:   Hemoglobin 17.9 (*)  All other components within normal limits  ETHANOL - Abnormal; Notable for the following components:   Alcohol, Ethyl (B) 165 (*)    All other components within normal limits  RESP PANEL BY RT-PCR (FLU A&B, COVID) ARPGX2  MAGNESIUM  LIPASE, BLOOD  HEPATIC FUNCTION PANEL    ____________________________________________  EKG: Sinus tachycardia, VR 109. PR 152, QRS 102, QTc 474. No acute St elevations or depressions. No ischemia or infarct. ________________________________________  RADIOLOGY All imaging, including plain films, CT scans, and ultrasounds, independently reviewed by me, and interpretations confirmed via formal radiology reads.  ED MD interpretation:   Sinus tachycardia, ventricular rate 109.  PR 152, QRS 90, QTc 474.  Incomplete right bundle branch block.  No acute ST elevations or depression.  Acute evidence of acute ischemia or infarct.  Official radiology report(s): CT ABDOMEN PELVIS WO CONTRAST  Result Date: 03/18/2021 CLINICAL DATA:  Abdominal pain, seizure, fall EXAM: CT ABDOMEN AND PELVIS WITHOUT CONTRAST TECHNIQUE: Multidetector CT imaging of the abdomen and pelvis was performed following the standard protocol without IV contrast. COMPARISON:  Renal ultrasound 02/22/2021, CT renal colic 02/21/2021 FINDINGS: Lower chest: Lung bases are clear. The no acute traumatic abnormality of the lower chest wall. Evaluation for rib fractures is limited by respiratory motion artifact. Hepatobiliary: No visible focal liver lesion or direct traumatic hepatic  injury is identified. No perihepatic hematoma. Diffuse hepatic hypoattenuation compatible with hepatic steatosis. Sparing along the gallbladder fossa. Intermediate attenuation within the gallbladder is nonspecific though can reflect biliary sludge. No pericholecystic fluid or inflammation. No visible calcified gallstones. No biliary ductal dilatation. Pancreas: Moderate pancreatic atrophy. No pancreatic ductal dilatation or surrounding inflammatory changes. Spleen: Normal in size. No concerning splenic lesions. No evidence of direct splenic injury or perisplenic hematoma. Adrenals/Urinary Tract: Normal adrenal glands without adrenal hemorrhage or suspicious lesion. Kidneys are symmetric in size and normally located. No convincing evidence of direct renal injury or perinephric hemorrhage. No worrisome visible or contour deforming renal lesions. Multiple bilateral nonobstructing renal calculi, largest is an 11 mm calculus in the upper pole left kidney. No obstructive urolithiasis or hydronephrosis. Urinary bladder is unremarkable. Stomach/Bowel: Postsurgical changes at the diaphragmatic hiatus likely from prior hiatal hernia repair with small recurrent hiatal hernia. No small bowel thickening or dilatation. Fecalized distal small bowel contents and a moderate colonic stool burden. No colonic dilatation or wall thickening. The appendix is surgically absent. No evidence of bowel obstruction. Vascular/Lymphatic: No significant vascular findings are present. No enlarged abdominal or pelvic lymph nodes. Reproductive: The prostate and seminal vesicles are unremarkable. Other: No large body wall or retroperitoneal hematoma. No traumatic abdominal wall dehiscence. Postsurgical changes from prior ventral hernia repair with multiple surgical mesh anchors. No abdominopelvic free air or fluid. Musculoskeletal: No acute traumatic findings of the bony pelvis or imaged lumbar spine. Musculature is normal and symmetric. IMPRESSION:  1. No acute traumatic findings in the abdomen or pelvis. 2. Nonspecific intermediate attenuation within the gallbladder, can reflect biliary sludge. However, no visible evidence of calcified gallstones, features of acute cholecystitis or biliary ductal dilatation. If there is clinical concern, consider right upper quadrant ultrasound. 3. Hepatic steatosis 4. Nonobstructing bilateral nephrolithiasis. No obstructive urolithiasis or hydronephrosis. 5. Postsurgical changes likely reflecting prior hiatal hernia repair with small residual sliding-type hiatal hernia. 6. Moderate colonic stool burden with fecalized distal small bowel contents, correlate for slowed intestinal transit/constipation. Electronically Signed   By: Kreg Shropshire M.D.   On: 03/18/2021 16:41   CT Head Wo Contrast  Result Date:  03/18/2021 CLINICAL DATA:  Seizure, fall EXAM: CT HEAD WITHOUT CONTRAST TECHNIQUE: Contiguous axial images were obtained from the base of the skull through the vertex without intravenous contrast. COMPARISON:  None. FINDINGS: Brain: No evidence of acute infarction, hemorrhage, hydrocephalus, extra-axial collection, visible mass lesion or mass effect. Vascular: No hyperdense vessel or unexpected calcification. Skull: No calvarial fracture or suspicious osseous lesion. No scalp swelling or hematoma. Sinuses/Orbits: Paranasal sinuses and mastoid air cells are predominantly clear. Included orbital structures are unremarkable. Other: None. IMPRESSION: No acute intracranial abnormality. Electronically Signed   By: Kreg Shropshire M.D.   On: 03/18/2021 16:42    ____________________________________________  PROCEDURES   Procedure(s) performed (including Critical Care):  Procedures  ____________________________________________  INITIAL IMPRESSION / MDM / ASSESSMENT AND PLAN / ED COURSE  As part of my medical decision making, I reviewed the following data within the electronic MEDICAL RECORD NUMBER Nursing notes reviewed and  incorporated, Old chart reviewed, Notes from prior ED visits, and [place] Controlled Substance Database       *Isaac Jackson was evaluated in Emergency Department on 03/18/2021 for the symptoms described in the history of present illness. He was evaluated in the context of the global COVID-19 pandemic, which necessitated consideration that the patient might be at risk for infection with the SARS-CoV-2 virus that causes COVID-19. Institutional protocols and algorithms that pertain to the evaluation of patients at risk for COVID-19 are in a state of rapid change based on information released by regulatory bodies including the CDC and federal and state organizations. These policies and algorithms were followed during the patient's care in the ED.  Some ED evaluations and interventions may be delayed as a result of limited staffing during the pandemic.*     Medical Decision Making:  50 yo M here with n/v, tremors, possible seizure like activity. Pt is HDS and well appearing. No focal neuro deficits. Concern for EtOH w/d, with possible seizure, though pt also had reported seizure-like activity w/ EMS while awake/responsive, so ? PNES as well. CT head negative. 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 Pt has been recently binging EtOH. Lipase negative. CMP unremarkable. Normal LFTs, bili. No RUQ TTP. CBC without leukocytosis. CT A/P reviewed, neg for acute abnormality. Possible sludge noted in GB but pt has normal LFTs, bili, doubt cholecystitis.  Discussed tx for gastritis and d/c with pt, who is reluctant to attempt outpt etoh withdrawal at this time. Will c/s TTS, Psych, and place on CIWA. He is voluntary at this time. Improved w/ ativan. No apparent emergent medical pathology.  ____________________________________________  FINAL CLINICAL IMPRESSION(S) / ED DIAGNOSES  Final diagnoses:  Alcohol dependence with uncomplicated intoxication (HCC)  Cellulitis of right lower extremity     MEDICATIONS  GIVEN DURING THIS VISIT:  Medications  thiamine (B-1) injection 100 mg (100 mg Intramuscular Not Given 03/18/21 1715)  thiamine tablet 100 mg (has no administration in time range)  multivitamin with minerals tablet 1 tablet (1 tablet Oral Given 03/18/21 1806)  LORazepam (ATIVAN) tablet 1 mg (has no administration in time range)  hydrOXYzine (ATARAX/VISTARIL) tablet 25 mg (has no administration in time range)  loperamide (IMODIUM) capsule 2-4 mg (has no administration in time range)  ondansetron (ZOFRAN-ODT) disintegrating tablet 4 mg (has no administration in time range)  LORazepam (ATIVAN) tablet 1 mg (1 mg Oral Given 03/18/21 1806)    Followed by  LORazepam (ATIVAN) tablet 1 mg (has no administration in time range)    Followed by  LORazepam (ATIVAN) tablet 1  mg (has no administration in time range)    Followed by  LORazepam (ATIVAN) tablet 1 mg (has no administration in time range)  alum & mag hydroxide-simeth (MAALOX/MYLANTA) 200-200-20 MG/5ML suspension 30 mL (has no administration in time range)  pantoprazole (PROTONIX) injection 40 mg (has no administration in time range)  lactated ringers bolus 1,000 mL (has no administration in time range)  alum & mag hydroxide-simeth (MAALOX/MYLANTA) 200-200-20 MG/5ML suspension 30 mL (has no administration in time range)    And  lidocaine (XYLOCAINE) 2 % viscous mouth solution 15 mL (has no administration in time range)  promethazine (PHENERGAN) 12.5 mg in sodium chloride 0.9 % 50 mL IVPB (has no administration in time range)  tamsulosin (FLOMAX) capsule 0.4 mg (has no administration in time range)  senna-docusate (Senokot-S) tablet 2 tablet (has no administration in time range)  polyethylene glycol (MIRALAX / GLYCOLAX) packet 17 g (has no administration in time range)  folic acid (FOLVITE) tablet 1 mg (has no administration in time range)  albuterol (VENTOLIN HFA) 108 (90 Base) MCG/ACT inhaler 1-2 puff (has no administration in time range)   doxycycline (VIBRAMYCIN) 100 mg in sodium chloride 0.9 % 250 mL IVPB (has no administration in time range)  doxycycline (VIBRA-TABS) tablet 100 mg (has no administration in time range)  LORazepam (ATIVAN) injection 1 mg (1 mg Intravenous Given 03/18/21 1544)  thiamine (B-1) injection 100 mg (100 mg Intravenous Given 03/18/21 1540)  sodium chloride 0.9 % bolus 1,000 mL (0 mLs Intravenous Stopped 03/18/21 1856)  ondansetron (ZOFRAN) injection 4 mg (4 mg Intravenous Given 03/18/21 1539)  haloperidol lactate (HALDOL) injection 2.5 mg (2.5 mg Intravenous Given 03/18/21 1608)     ED Discharge Orders    None       Note:  This document was prepared using Dragon voice recognition software and may include unintentional dictation errors.   Shaune Pollack, MD 03/18/21 Ernestina [place]

## 2021-03-18 NOTE — ED Triage Notes (Signed)
Pt comes into the ED via [facility] from [facility] where he had a seizure and a fall.  Pt states that he used to have a history of seizures.  Per EMS, he kept "flopping" out of the wheelchair.  This RN noticed the patient doing the same thing out of the triage recliner.  Pt does not present postictal at this time and is answering questions appropriately but states he had another seizure.  Pt has even and unlabored respirations.  Pt denies that he takes any seizure medication.  Pt also informed EMS that he had some N/V/D and fell right outside an elevator today.  No injuries noted at this time.

## 2021-03-18 NOTE — ED Notes (Signed)
VOL, unable to assess by psych at this time

## 2021-03-18 NOTE — Consult Note (Signed)
Attempted to see patient with TTS, would not awaken.  RN reported he just received Ativan.  Psych will continue to follow.  Nanine Means, PMHNP

## 2021-03-18 NOTE — ED Notes (Signed)
Hospitalist at bedside 

## 2021-03-18 NOTE — ED Notes (Signed)
[facility] called for transport back to pts residence 

## 2021-03-18 NOTE — H&P (Signed)
History and Physical    Isaac Jackson XLK:[id number] DOB: 1970-10-26 DOA: 03/18/2021  PCP: Pcp, No  Patient coming from: [facility] via EMS  I have personally briefly reviewed patient's old medical records in [facility]  Chief Complaint: N/V, abdominal pain  HPI: Isaac Jackson is a 50 y.o. male with medical history significant for alcohol use disorder with prior history of withdrawal and alcohol associated pancreatitis, hypertension, history of renal stones, depression/anxiety, tobacco use who presents to the ED for evaluation of epigastric pain, nausea, vomiting.  Patient reports chronic alcohol use, 3-5 liquor drinks per day.  He reported higher daily intake to the emergency provider.  He says over the last 3 days he has been having frequent nausea with vomiting.  He has had nonradiating epigastric pain.  He has had some chills and diaphoresis.  He says his last alcoholic drink was yesterday 5/26.  He has been having tremors today.  He reports decreased urine output than expected.  Per ED triage notes, [facility] due to staff reporting "seizure-like activity."  He reportedly fell today as well without any significant injury.  EMS noted that patient kept "flopping" out of the wheelchair.  This was reported as seizure-like activity but patient did not have any loss of consciousness or postictal phase.  Patient also reports erythema and pain at his right lateral ankle area.  He says the area was pruritic and he has been scratching at it.  He saw some discharge from the area earlier.  Patient reports ongoing tobacco use, less than half pack per day.  He denies any cocaine or IV drug use.  He says the only medications he has been taking her losartan 100 mg, HCTZ 12.5 mg, and Flomax but has not taken any of these in the last week.  ED Course:  Initial vitals showed BP 136/100, pulse 113, RR 18, temp 98.1 F, SPO2 98% on room air.   While in the ED patient remained tachycardic with pulse ranging between 120-140.  Labs show sodium 136, potassium 3.4, bicarb 26, BUN 12, creatinine 1.05, serum glucose 111, WBC 8.4, hemoglobin 17.9, platelets 236,000, serum ethanol 165, magnesium 2.1, lipase 30, LFTs within normal limits.  SARS-CoV-2 PCR is negative.  Influenza A/B PCR negative.  CT head without contrast is negative for acute intracranial abnormality.  CT abdomen/pelvis without contrast is negative for acute traumatic findings in the abdomen or pelvis.  Nonspecific attenuation within the gallbladder seen, may reflect biliary sludge.  Follow-up RUQ ultrasound showed well distended gallbladder with sludge, CBD diameter 3.5 mm, no wall thickening or pericholecystic fluid noted.  Portable chest x-ray negative for focal consolidation, edema, or effusion.  Patient received IV Ativan 1 mg x 2, 2 mg x 1, Haldol 2.5 mg x 1, 2 L LR, 1 L NS.  He was placed on CIWA protocol and Ativan taper receiving additional 1 mg Ativan orally x2.  He was given IV doxycycline for reported cellulitis.  Due to concern for complicated alcohol withdrawal, the hospitalist service was consulted to admit for further evaluation and management.  Review of Systems: All systems reviewed and are negative except as documented in history of present illness above.   Past Medical History:  Diagnosis Date  . Asthma   . History of alcohol abuse    patient reports that he is an alcoholic and goes to AA  . Hypertension   . Kidney stone   . Renal  disorder     Past Surgical History:  Procedure Laterality Date  . ABDOMINAL SURGERY    . APPENDECTOMY    . HERNIA REPAIR      Social History:  reports that he has been smoking cigarettes. He has never used smokeless tobacco. He reports current alcohol use. He reports that he does not use drugs.  No Known Allergies  Family History  Problem Relation Age of Onset  . Diabetes Mother   . Prostate cancer Father    . Alcohol abuse Father      Prior to Admission medications   Medication Sig Start Date End Date Taking? Authorizing Provider  albuterol (VENTOLIN HFA) 108 (90 Base) MCG/ACT inhaler Inhale 1-2 puffs into the lungs every 6 (six) hours as needed. 10/23/19   [provider]  folic acid (FOLVITE) 1 MG tablet Take 1 tablet (1 mg total) by mouth daily. 02/24/21   Glade Lloyd, MD  hydrOXYzine (VISTARIL) 25 MG capsule Take 25 mg by mouth 3 (three) times daily as needed. 12/01/20   [provider]  Multiple Vitamin (MULTIVITAMIN WITH MINERALS) TABS tablet Take 1 tablet by mouth daily. 02/24/21   Glade Lloyd, MD  ondansetron (ZOFRAN-ODT) 4 MG disintegrating tablet Take 1 tablet (4 mg total) by mouth every 6 (six) hours as needed for nausea or vomiting. 02/23/21   Glade Lloyd, MD  polyethylene glycol (MIRALAX / GLYCOLAX) 17 g packet Take 17 g by mouth 2 (two) times daily. 02/23/21   Glade Lloyd, MD  senna-docusate (SENOKOT-S) 8.6-50 MG tablet Take 2 tablets by mouth 2 (two) times daily. 02/23/21   Glade Lloyd, MD  tamsulosin (FLOMAX) 0.4 MG CAPS capsule Take 0.4 mg by mouth daily. 02/06/21   [provider]  thiamine 100 MG tablet Take 1 tablet (100 mg total) by mouth daily. 02/24/21   Glade Lloyd, MD    Physical Exam: Vitals:   03/18/21 2200 03/18/21 2218 03/18/21 2230 03/18/21 2236  BP: 111/79  121/76 121/76  Pulse: (!) 118  (!) 122 (!) 122  Resp: 18   17  Temp:  99.7 F (37.6 C)    TempSrc:  Oral    SpO2: 91%   99%  Weight:      Height:       Constitutional: Thin man resting supine in bed, NAD, calm, comfortable Eyes: PERRL, lids and conjunctivae normal ENMT: Mucous membranes are dry. Posterior pharynx clear of any exudate or lesions.Normal dentition.  Neck: normal, supple, no masses. Respiratory: clear to auscultation bilaterally, no wheezing, no crackles. Normal respiratory effort. No accessory muscle use.  Cardiovascular: Tachycardic with regular rhythm,  no murmurs / rubs / gallops. No extremity edema. 2+ pedal pulses. Abdomen: Mild epigastric tenderness, no masses palpated. Bowel sounds positive.  Musculoskeletal: no clubbing / cyanosis. No joint deformity upper and lower extremities. Good ROM, no contractures. Normal muscle tone.  Skin: no rashes, lesions, ulcers. No induration Neurologic: Mild tremulousness, CN 2-12 grossly intact. Sensation intact. Strength 5/5 in all 4.  Psychiatric: Alert and oriented x 3. Normal mood.   Labs on Admission: I have personally reviewed following labs and imaging studies  CBC: Recent Labs  Lab 03/18/21 1446  WBC 8.4  HGB 17.9*  HCT 50.5  MCV 91.0  PLT 236   Basic Metabolic Panel: Recent Labs  Lab 03/18/21 1446 03/18/21 1537  NA 136  --   K 3.4*  --   CL 95*  --   CO2 26  --   GLUCOSE 111*  --  BUN 12  --   CREATININE 1.05  --   CALCIUM 9.5  --   MG  --  2.1   GFR: Estimated Creatinine Clearance: 76.4 mL/min (by C-G formula based on SCr of 1.05 mg/dL). Liver Function Tests: Recent Labs  Lab 03/18/21 1537  AST 24  ALT 20  ALKPHOS 63  BILITOT 0.9  PROT 8.1  ALBUMIN 4.7   Recent Labs  Lab 03/18/21 1537  LIPASE 30   No results for input(s): AMMONIA in the last 168 hours. Coagulation Profile: No results for input(s): INR, PROTIME in the last 168 hours. Cardiac Enzymes: No results for input(s): CKTOTAL, CKMB, CKMBINDEX, TROPONINI in the last 168 hours. BNP (last 3 results) No results for input(s): PROBNP in the last 8760 hours. HbA1C: No results for input(s): HGBA1C in the last 72 hours. CBG: No results for input(s): GLUCAP in the last 168 hours. Lipid Profile: No results for input(s): CHOL, HDL, LDLCALC, TRIG, CHOLHDL, LDLDIRECT in the last 72 hours. Thyroid Function Tests: No results for input(s): TSH, T4TOTAL, FREET4, T3FREE, THYROIDAB in the last 72 hours. Anemia Panel: No results for input(s): VITAMINB12, FOLATE, FERRITIN, TIBC, IRON, RETICCTPCT in the last 72  hours. Urine analysis:    Component Value Date/Time   COLORURINE YELLOW (A) 02/21/2021 1345   APPEARANCEUR CLEAR (A) 02/21/2021 1345   LABSPEC 1.005 02/21/2021 1345   PHURINE 7.0 02/21/2021 1345   GLUCOSEU NEGATIVE 02/21/2021 1345   HGBUR MODERATE (A) 02/21/2021 1345   BILIRUBINUR NEGATIVE 02/21/2021 1345   KETONESUR NEGATIVE 02/21/2021 1345   PROTEINUR NEGATIVE 02/21/2021 1345   NITRITE NEGATIVE 02/21/2021 1345   LEUKOCYTESUR NEGATIVE 02/21/2021 1345    Radiological Exams on Admission: CT ABDOMEN PELVIS WO CONTRAST  Result Date: 03/18/2021 CLINICAL DATA:  Abdominal pain, seizure, fall EXAM: CT ABDOMEN AND PELVIS WITHOUT CONTRAST TECHNIQUE: Multidetector CT imaging of the abdomen and pelvis was performed following the standard protocol without IV contrast. COMPARISON:  Renal ultrasound 02/22/2021, CT renal colic 02/21/2021 FINDINGS: Lower chest: Lung bases are clear. The no acute traumatic abnormality of the lower chest wall. Evaluation for rib fractures is limited by respiratory motion artifact. Hepatobiliary: No visible focal liver lesion or direct traumatic hepatic injury is identified. No perihepatic hematoma. Diffuse hepatic hypoattenuation compatible with hepatic steatosis. Sparing along the gallbladder fossa. Intermediate attenuation within the gallbladder is nonspecific though can reflect biliary sludge. No pericholecystic fluid or inflammation. No visible calcified gallstones. No biliary ductal dilatation. Pancreas: Moderate pancreatic atrophy. No pancreatic ductal dilatation or surrounding inflammatory changes. Spleen: Normal in size. No concerning splenic lesions. No evidence of direct splenic injury or perisplenic hematoma. Adrenals/Urinary Tract: Normal adrenal glands without adrenal hemorrhage or suspicious lesion. Kidneys are symmetric in size and normally located. No convincing evidence of direct renal injury or perinephric hemorrhage. No worrisome visible or contour deforming  renal lesions. Multiple bilateral nonobstructing renal calculi, largest is an 11 mm calculus in the upper pole left kidney. No obstructive urolithiasis or hydronephrosis. Urinary bladder is unremarkable. Stomach/Bowel: Postsurgical changes at the diaphragmatic hiatus likely from prior hiatal hernia repair with small recurrent hiatal hernia. No small bowel thickening or dilatation. Fecalized distal small bowel contents and a moderate colonic stool burden. No colonic dilatation or wall thickening. The appendix is surgically absent. No evidence of bowel obstruction. Vascular/Lymphatic: No significant vascular findings are present. No enlarged abdominal or pelvic lymph nodes. Reproductive: The prostate and seminal vesicles are unremarkable. Other: No large body wall or retroperitoneal hematoma. No traumatic abdominal wall dehiscence. Postsurgical  changes from prior ventral hernia repair with multiple surgical mesh anchors. No abdominopelvic free air or fluid. Musculoskeletal: No acute traumatic findings of the bony pelvis or imaged lumbar spine. Musculature is normal and symmetric. IMPRESSION: 1. No acute traumatic findings in the abdomen or pelvis. 2. Nonspecific intermediate attenuation within the gallbladder, can reflect biliary sludge. However, no visible evidence of calcified gallstones, features of acute cholecystitis or biliary ductal dilatation. If there is clinical concern, consider right upper quadrant ultrasound. 3. Hepatic steatosis 4. Nonobstructing bilateral nephrolithiasis. No obstructive urolithiasis or hydronephrosis. 5. Postsurgical changes likely reflecting prior hiatal hernia repair with small residual sliding-type hiatal hernia. 6. Moderate colonic stool burden with fecalized distal small bowel contents, correlate for slowed intestinal transit/constipation. Electronically Signed   By: Kreg Shropshire M.D.   On: 03/18/2021 16:41   CT Head Wo Contrast  Result Date: 03/18/2021 CLINICAL DATA:  Seizure,  fall EXAM: CT HEAD WITHOUT CONTRAST TECHNIQUE: Contiguous axial images were obtained from the base of the skull through the vertex without intravenous contrast. COMPARISON:  None. FINDINGS: Brain: No evidence of acute infarction, hemorrhage, hydrocephalus, extra-axial collection, visible mass lesion or mass effect. Vascular: No hyperdense vessel or unexpected calcification. Skull: No calvarial fracture or suspicious osseous lesion. No scalp swelling or hematoma. Sinuses/Orbits: Paranasal sinuses and mastoid air cells are predominantly clear. Included orbital structures are unremarkable. Other: None. IMPRESSION: No acute intracranial abnormality. Electronically Signed   By: Kreg Shropshire M.D.   On: 03/18/2021 16:42   DG Chest Portable 1 View  Result Date: 03/18/2021 CLINICAL DATA:  50 year old male with cough. EXAM: PORTABLE CHEST 1 VIEW COMPARISON:  None. FINDINGS: The heart size and mediastinal contours are within normal limits. Both lungs are clear. The visualized skeletal structures are unremarkable. IMPRESSION: No active disease. Electronically Signed   By: Elgie Collard M.D.   On: 03/18/2021 20:53   US Abdomen Limited RUQ (LIVER/GB)  Result Date: 03/18/2021 CLINICAL DATA:  Right upper quadrant pain EXAM: ULTRASOUND ABDOMEN LIMITED RIGHT UPPER QUADRANT COMPARISON:  CT from earlier in the same day. FINDINGS: Gallbladder: Gallbladder is well distended with sludge within. No gallstones are identified. No wall thickening or pericholecystic fluid is noted. Common bile duct: Diameter: 3.5 mm. Liver: Increased in echogenicity consistent with fatty infiltration. Portal vein is patent on color Doppler imaging with normal direction of blood flow towards the liver. Other: None. IMPRESSION: Fatty liver similar to that seen on prior CT. Gallbladder well distended with sludge. Electronically Signed   By: Alcide Clever M.D.   On: 03/18/2021 21:29    EKG: Personally reviewed. Sinus tachycardia, rate 109, incomplete  RBBB.  Rate is faster when compared to prior.  Assessment/Plan Principal Problem:   Alcohol use with intoxication with complication (HCC) Active Problems:   HTN (hypertension)   BPH (benign prostatic hyperplasia)   Isaac Jackson is a 50 y.o. male with medical history significant for alcohol use disorder with prior history of withdrawal and alcohol associated pancreatitis, hypertension, history of renal stones, depression/anxiety, tobacco use who is admitted with alcohol use disorder with withdrawal.  Severe alcohol use disorder with concern for early alcohol withdrawal: Despite elevated serum ethanol level, he appears to be in early alcohol withdrawal.  Reported last drink was 5/26.  Also has prior history of alcohol withdrawal.  Denies any current hallucinations.  There is report of possible seizure-like activity however this was felt more likely to be related to tremors. -Admit to stepdown unit for now -Placed on CIWA protocol with Ativan as  needed -Start Librium taper -Continue IV fluid hydration overnight  Hypertension: Has been off home losartan and HCTZ for 1 week.  BP currently stable, can add back antihypertensives if needed.  Hypokalemia: Add potassium supplement to fluids.  Magnesium 2.1.  Right lateral ankle erythema: Possible early cellulitis.  Continue oral doxycycline for now.  Hepatic steatosis: Seen on CT imaging, likely related to ongoing alcohol use.  Depression/anxiety: Not currently on medical therapy.  BPH: Continue Flomax.  Tobacco use: Nicotine patch ordered.   DVT prophylaxis: Lovenox Code Status: DNR, confirmed with patient on admission Family Communication: Discussed with patient, he has discussed with an acquaintance Disposition Plan: From home, dispo pending clinical progress. Consults called: None Level of care: Stepdown Admission status:  Status is: Observation  The patient remains OBS appropriate and will d/c before 2  midnights.  Dispo: The patient is from: Home              Anticipated d/c is to: Home versus inpatient detox facility              Patient currently is not medically stable to d/c.   Darreld Mclean MD [facility]  If 7PM-7AM, please contact night-coverage [email or web address]  03/18/2021, 10:46 PM

## 2021-03-19 DIAGNOSIS — L299 Pruritus, unspecified: Secondary | ICD-10-CM | POA: Diagnosis present

## 2021-03-19 DIAGNOSIS — K760 Fatty (change of) liver, not elsewhere classified: Secondary | ICD-10-CM | POA: Diagnosis present

## 2021-03-19 DIAGNOSIS — F10929 Alcohol use, unspecified with intoxication, unspecified: Secondary | ICD-10-CM

## 2021-03-19 DIAGNOSIS — F10939 Alcohol use, unspecified with withdrawal, unspecified: Secondary | ICD-10-CM | POA: Diagnosis present

## 2021-03-19 DIAGNOSIS — F10231 Alcohol dependence with withdrawal delirium: Secondary | ICD-10-CM | POA: Diagnosis present

## 2021-03-19 DIAGNOSIS — R112 Nausea with vomiting, unspecified: Secondary | ICD-10-CM | POA: Diagnosis present

## 2021-03-19 DIAGNOSIS — K828 Other specified diseases of gallbladder: Secondary | ICD-10-CM | POA: Diagnosis present

## 2021-03-19 DIAGNOSIS — F32A Depression, unspecified: Secondary | ICD-10-CM | POA: Diagnosis present

## 2021-03-19 DIAGNOSIS — Z87442 Personal history of urinary calculi: Secondary | ICD-10-CM | POA: Diagnosis not present

## 2021-03-19 DIAGNOSIS — Z811 Family history of alcohol abuse and dependence: Secondary | ICD-10-CM | POA: Diagnosis not present

## 2021-03-19 DIAGNOSIS — Z9049 Acquired absence of other specified parts of digestive tract: Secondary | ICD-10-CM | POA: Diagnosis not present

## 2021-03-19 DIAGNOSIS — E876 Hypokalemia: Secondary | ICD-10-CM | POA: Diagnosis present

## 2021-03-19 DIAGNOSIS — Z6823 Body mass index (BMI) 23.0-23.9, adult: Secondary | ICD-10-CM | POA: Diagnosis not present

## 2021-03-19 DIAGNOSIS — F10220 Alcohol dependence with intoxication, uncomplicated: Secondary | ICD-10-CM | POA: Diagnosis present

## 2021-03-19 DIAGNOSIS — Z79899 Other long term (current) drug therapy: Secondary | ICD-10-CM | POA: Diagnosis not present

## 2021-03-19 DIAGNOSIS — R569 Unspecified convulsions: Secondary | ICD-10-CM | POA: Diagnosis present

## 2021-03-19 DIAGNOSIS — I10 Essential (primary) hypertension: Secondary | ICD-10-CM | POA: Diagnosis present

## 2021-03-19 DIAGNOSIS — W050XXA Fall from non-moving wheelchair, initial encounter: Secondary | ICD-10-CM | POA: Diagnosis present

## 2021-03-19 DIAGNOSIS — L03115 Cellulitis of right lower limb: Secondary | ICD-10-CM | POA: Diagnosis present

## 2021-03-19 DIAGNOSIS — Z66 Do not resuscitate: Secondary | ICD-10-CM | POA: Diagnosis present

## 2021-03-19 DIAGNOSIS — F10239 Alcohol dependence with withdrawal, unspecified: Secondary | ICD-10-CM | POA: Diagnosis present

## 2021-03-19 DIAGNOSIS — Y906 Blood alcohol level of 120-199 mg/100 ml: Secondary | ICD-10-CM | POA: Diagnosis present

## 2021-03-19 DIAGNOSIS — E440 Moderate protein-calorie malnutrition: Secondary | ICD-10-CM | POA: Diagnosis present

## 2021-03-19 DIAGNOSIS — J45909 Unspecified asthma, uncomplicated: Secondary | ICD-10-CM | POA: Diagnosis present

## 2021-03-19 DIAGNOSIS — N400 Enlarged prostate without lower urinary tract symptoms: Secondary | ICD-10-CM | POA: Diagnosis present

## 2021-03-19 DIAGNOSIS — F419 Anxiety disorder, unspecified: Secondary | ICD-10-CM | POA: Diagnosis present

## 2021-03-19 DIAGNOSIS — Z20822 Contact with and (suspected) exposure to covid-19: Secondary | ICD-10-CM | POA: Diagnosis present

## 2021-03-19 DIAGNOSIS — E875 Hyperkalemia: Secondary | ICD-10-CM | POA: Diagnosis not present

## 2021-03-19 DIAGNOSIS — F17210 Nicotine dependence, cigarettes, uncomplicated: Secondary | ICD-10-CM | POA: Diagnosis present

## 2021-03-19 DIAGNOSIS — F1020 Alcohol dependence, uncomplicated: Secondary | ICD-10-CM | POA: Diagnosis present

## 2021-03-19 LAB — BASIC METABOLIC PANEL
Anion gap: 7 (ref 5–15)
BUN: 15 mg/dL (ref 6–20)
CO2: 25 mmol/L (ref 22–32)
Calcium: 8.2 mg/dL — ABNORMAL LOW (ref 8.9–10.3)
Chloride: 105 mmol/L (ref 98–111)
Creatinine, Ser: 1.04 mg/dL (ref 0.61–1.24)
GFR, Estimated: >60 mL/min (ref >60)
Glucose, Bld: 121 mg/dL — ABNORMAL HIGH (ref 70–99)
Potassium: 5.3 mmol/L — ABNORMAL HIGH (ref 3.5–5.1)
Sodium: 137 mmol/L (ref 135–145)

## 2021-03-19 LAB — CBC
HCT: 37.2 % — ABNORMAL LOW (ref 39.0–52.0)
Hemoglobin: 13.2 g/dL (ref 13.0–17.0)
MCH: 32.9 pg (ref 26.0–34.0)
MCHC: 35.5 g/dL (ref 30.0–36.0)
MCV: 92.8 fL (ref 80.0–100.0)
Platelets: 160 10*3/uL (ref 150–400)
RBC: 4.01 MIL/uL — ABNORMAL LOW (ref 4.22–5.81)
RDW: 14.6 % (ref 11.5–15.5)
WBC: 6.3 10*3/uL (ref 4.0–10.5)
nRBC: 0 % (ref 0.0–0.2)

## 2021-03-19 LAB — RESP PANEL BY RT-PCR (FLU A&B, COVID) ARPGX2

## 2021-03-19 LAB — HIV ANTIBODY (ROUTINE TESTING W REFLEX): HIV Screen 4th Generation wRfx: NONREACTIVE

## 2021-03-19 MED ORDER — TEMAZEPAM 7.5 MG PO CAPS
7.5000 mg | ORAL_CAPSULE | Freq: Once | ORAL | Status: AC
  Administered 2021-03-19: 7.5 mg via ORAL
  Filled 2021-03-19: qty 1

## 2021-03-19 MED ORDER — LACTATED RINGERS IV SOLN: INTRAVENOUS | Status: DC

## 2021-03-19 NOTE — ED Notes (Signed)
Pt accidentally dropped librium capsule on floor so this RN wasted in pyxis with another RN and pulled another 25mg  capsule and gave to pt.

## 2021-03-19 NOTE — ED Notes (Signed)
Verbal from floor RN ok to go ahead and take pt up.

## 2021-03-19 NOTE — ED Notes (Signed)
Messaged RN bed assigned 

## 2021-03-19 NOTE — ED Notes (Signed)
Messaged floor RN and called floor secretary to turn purple man green so NT can take pt to floor.

## 2021-03-19 NOTE — ED Notes (Signed)
Pt has red raised area and open wound to R ankle. States it has been itching so he put itch cream on it at home. Notified floor RN of wound.

## 2021-03-19 NOTE — ED Notes (Signed)
VOL, patient to be admitted for medical

## 2021-03-19 NOTE — Progress Notes (Signed)
PROGRESS NOTE    Isaac Jackson  VHQ:[id number] DOB: [birth year] DOA: 03/18/2021 PCP: Pcp, No  Outpatient Specialists: none    Brief Narrative:   Isaac Jackson is a 50 y.o. male with medical history significant for alcohol use disorder with prior history of withdrawal and alcohol associated pancreatitis, hypertension, history of renal stones, depression/anxiety, tobacco use who presents to the ED for evaluation of epigastric pain, nausea, vomiting.  Patient reports chronic alcohol use, 3-5 liquor drinks per day.  He reported higher daily intake to the emergency provider.  He says over the last 3 days he has been having frequent nausea with vomiting.  He has had nonradiating epigastric pain.  He has had some chills and diaphoresis.  He says his last alcoholic drink was yesterday 5/26.  He has been having tremors today.  He reports decreased urine output than expected.  Per ED triage notes, [facility] due to staff reporting "seizure-like activity."  He reportedly fell today as well without any significant injury.  EMS noted that patient kept "flopping" out of the wheelchair.  This was reported as seizure-like activity but patient did not have any loss of consciousness or postictal phase.  Patient also reports erythema and pain at his right lateral ankle area.  He says the area was pruritic and he has been scratching at it.  He saw some discharge from the area earlier.  Patient reports ongoing tobacco use, less than half pack per day.  He denies any cocaine or IV drug use.  He says the only medications he has been taking her losartan 100 mg, HCTZ 12.5 mg, and Flomax but has not taken any of these in the last week.   Assessment & Plan:   Principal Problem:   Alcohol use with intoxication with complication (HCC) Active Problems:   HTN (hypertension)   BPH (benign prostatic hyperplasia)   # Alcohol dependence # Alcohol  withdrawal # History complicated withdrawal Last drink 5/26, etoh level elevated on arrival. Reports hx etoh withdrawal seizure. Reports current symptoms typical for his withdrawal - librium taper, ciwa ativan protocol - LR @ 100 given nausea/vomiting. CT abd/pelvis unremarkable - seizure precautions - thiamine/folate - daily mg, phos - f/u hepatitis panel  # Hypertension: Has been off home losartan and HCTZ for 1 week. bp wnl - monitor  # Hypokalemia: Hyperkalemic w/ ivf with k, stop those - monitor  # Right lateral ankle cellulitis No signs abscess - cont doxy  # Hepatic steatosis: Seen on CT imaging, likely related to ongoing alcohol use.  # Depression/anxiety: Not currently on medical therapy.  # BPH: Continue Flomax.  # Tobacco use: Nicotine patch ordered.   DVT prophylaxis: lovenox Code Status: full Family Communication: none @ bedside. Mother is primary contact. Pt requests we not contact her  Level of care: Stepdown Status is: Observation  The patient will require care spanning > 2 midnights and should be moved to inpatient because: Inpatient level of care appropriate due to severity of illness  Dispo: The patient is from: Home              Anticipated d/c is to: Home              Patient currently is not medically stable to d/c.   Difficult to place patient No        Consultants:  none  Procedures: none  Antimicrobials:  none    Subjective: This morning  tremulous, slightly nauseaus.  Objective: Vitals:   03/19/21 0430 03/19/21 0500 03/19/21 0530 03/19/21 0700  BP: 106/72 115/90 104/66 117/88  Pulse: 85 82 78 69  Resp: 20 (!) 21 18 20   Temp:      TempSrc:      SpO2: 95% 100% 100% 100%  Weight:      Height:        Intake/Output Summary (Last 24 hours) at 03/19/2021 0808 Last data filed at 03/18/2021 2349 Gross per 24 hour  Intake 2298.19 ml  Output --  Net 2298.19 ml   Filed Weights   03/18/21 1415  Weight: 63.5 kg     Examination:  General exam: Appears anxious Respiratory system: Clear to auscultation. Respiratory effort normal. Cardiovascular system: S1 & S2 heard, RRR. No JVD, murmurs, rubs, gallops or clicks. No pedal edema. Gastrointestinal system: Abdomen is nondistended, soft and nontender. No organomegaly or masses felt. Normal bowel sounds heard. Central nervous system: Alert and oriented. No focal neurological deficits. Extremities: Symmetric 5 x 5 power. Skin: No rashes, lesions or ulcers Psychiatry: Judgement and insight appear normal.     Data Reviewed: I have personally reviewed following labs and imaging studies  CBC: Recent Labs  Lab 03/18/21 1446 03/19/21 0518  WBC 8.4 6.3  HGB 17.9* 13.2  HCT 50.5 37.2*  MCV 91.0 92.8  PLT 236 160   Basic Metabolic Panel: Recent Labs  Lab 03/18/21 1446 03/18/21 1537 03/19/21 0518  NA 136  --  137  K 3.4*  --  5.3*  CL 95*  --  105  CO2 26  --  25  GLUCOSE 111*  --  121*  BUN 12  --  15  CREATININE 1.05  --  1.04  CALCIUM 9.5  --  8.2*  MG  --  2.1  --    GFR: Estimated Creatinine Clearance: 77.2 mL/min (by C-G formula based on SCr of 1.04 mg/dL). Liver Function Tests: Recent Labs  Lab 03/18/21 1537  AST 24  ALT 20  ALKPHOS 63  BILITOT 0.9  PROT 8.1  ALBUMIN 4.7   Recent Labs  Lab 03/18/21 1537  LIPASE 30   No results for input(s): AMMONIA in the last 168 hours. Coagulation Profile: No results for input(s): INR, PROTIME in the last 168 hours. Cardiac Enzymes: No results for input(s): CKTOTAL, CKMB, CKMBINDEX, TROPONINI in the last 168 hours. BNP (last 3 results) No results for input(s): PROBNP in the last 8760 hours. HbA1C: No results for input(s): HGBA1C in the last 72 hours. CBG: No results for input(s): GLUCAP in the last 168 hours. Lipid Profile: No results for input(s): CHOL, HDL, LDLCALC, TRIG, CHOLHDL, LDLDIRECT in the last 72 hours. Thyroid Function Tests: No results for input(s): TSH,  T4TOTAL, FREET4, T3FREE, THYROIDAB in the last 72 hours. Anemia Panel: No results for input(s): VITAMINB12, FOLATE, FERRITIN, TIBC, IRON, RETICCTPCT in the last 72 hours. Urine analysis:    Component Value Date/Time   COLORURINE YELLOW (A) 02/21/2021 1345   APPEARANCEUR CLEAR (A) 02/21/2021 1345   LABSPEC 1.005 02/21/2021 1345   PHURINE 7.0 02/21/2021 1345   GLUCOSEU NEGATIVE 02/21/2021 1345   HGBUR MODERATE (A) 02/21/2021 1345   BILIRUBINUR NEGATIVE 02/21/2021 1345   KETONESUR NEGATIVE 02/21/2021 1345   PROTEINUR NEGATIVE 02/21/2021 1345   NITRITE NEGATIVE 02/21/2021 1345   LEUKOCYTESUR NEGATIVE 02/21/2021 1345   Sepsis Labs: @LABRCNTIP (procalcitonin:4,lacticidven:4)  ) Recent Results (from the past 240 hour(s))  Resp Panel by RT-PCR (Flu A&B, Covid)  Status: None   Collection Time: 03/18/21  9:11 PM  Result Value Ref Range Status   SARS Coronavirus 2 by RT PCR NEGATIVE NEGATIVE Final    Comment: (NOTE) SARS-CoV-2 target nucleic acids are NOT DETECTED.  The SARS-CoV-2 RNA is generally detectable in upper respiratory specimens during the acute phase of infection. The lowest concentration of SARS-CoV-2 viral copies this assay can detect is 138 copies/mL. A negative result does not preclude SARS-Cov-2 infection and should not be used as the sole basis for treatment or other patient management decisions. A negative result may occur with  improper specimen collection/handling, submission of specimen other than nasopharyngeal swab, presence of viral mutation(s) within the areas targeted by this assay, and inadequate number of viral copies(<138 copies/mL). A negative result must be combined with clinical observations, patient history, and epidemiological information. The expected result is Negative.  Fact Sheet for Patients:  BloggerCourse.com  Fact Sheet for Healthcare Providers:  SeriousBroker.it  This test is no t yet  approved or cleared by the [place] FDA and  has been authorized for detection and/or diagnosis of SARS-CoV-2 by FDA under an Emergency Use Authorization (EUA). This EUA will remain  in effect (meaning this test can be used) for the duration of the COVID-19 declaration under Section 564(b)(1) of the Act, 21 U.S.C.section 360bbb-3(b)(1), unless the authorization is terminated  or revoked sooner.       Influenza A by PCR NEGATIVE NEGATIVE Final   Influenza B by PCR NEGATIVE NEGATIVE Final    Comment: (NOTE) The Xpert Xpress SARS-CoV-2/FLU/RSV plus assay is intended as an aid in the diagnosis of influenza from Nasopharyngeal swab specimens and should not be used as a sole basis for treatment. Nasal washings and aspirates are unacceptable for Xpert Xpress SARS-CoV-2/FLU/RSV testing.  Fact Sheet for Patients: BloggerCourse.com  Fact Sheet for Healthcare Providers: SeriousBroker.it  This test is not yet approved or cleared by the [place] FDA and has been authorized for detection and/or diagnosis of SARS-CoV-2 by FDA under an Emergency Use Authorization (EUA). This EUA will remain in effect (meaning this test can be used) for the duration of the COVID-19 declaration under Section 564(b)(1) of the Act, 21 U.S.C. section 360bbb-3(b)(1), unless the authorization is terminated or revoked.  Performed at [facility], [address] [place], [place] [address]          Radiology Studies: CT ABDOMEN PELVIS WO CONTRAST  Result Date: 03/18/2021 CLINICAL DATA:  Abdominal pain, seizure, fall EXAM: CT ABDOMEN AND PELVIS WITHOUT CONTRAST TECHNIQUE: Multidetector CT imaging of the abdomen and pelvis was performed following the standard protocol without IV contrast. COMPARISON:  Renal ultrasound 02/22/2021, CT renal colic 02/21/2021 FINDINGS: Lower chest: Lung bases are clear. The no acute traumatic abnormality of the lower  chest wall. Evaluation for rib fractures is limited by respiratory motion artifact. Hepatobiliary: No visible focal liver lesion or direct traumatic hepatic injury is identified. No perihepatic hematoma. Diffuse hepatic hypoattenuation compatible with hepatic steatosis. Sparing along the gallbladder fossa. Intermediate attenuation within the gallbladder is nonspecific though can reflect biliary sludge. No pericholecystic fluid or inflammation. No visible calcified gallstones. No biliary ductal dilatation. Pancreas: Moderate pancreatic atrophy. No pancreatic ductal dilatation or surrounding inflammatory changes. Spleen: Normal in size. No concerning splenic lesions. No evidence of direct splenic injury or perisplenic hematoma. Adrenals/Urinary Tract: Normal adrenal glands without adrenal hemorrhage or suspicious lesion. Kidneys are symmetric in size and normally located. No convincing evidence of direct renal injury or perinephric hemorrhage. No  worrisome visible or contour deforming renal lesions. Multiple bilateral nonobstructing renal calculi, largest is an 11 mm calculus in the upper pole left kidney. No obstructive urolithiasis or hydronephrosis. Urinary bladder is unremarkable. Stomach/Bowel: Postsurgical changes at the diaphragmatic hiatus likely from prior hiatal hernia repair with small recurrent hiatal hernia. No small bowel thickening or dilatation. Fecalized distal small bowel contents and a moderate colonic stool burden. No colonic dilatation or wall thickening. The appendix is surgically absent. No evidence of bowel obstruction. Vascular/Lymphatic: No significant vascular findings are present. No enlarged abdominal or pelvic lymph nodes. Reproductive: The prostate and seminal vesicles are unremarkable. Other: No large body wall or retroperitoneal hematoma. No traumatic abdominal wall dehiscence. Postsurgical changes from prior ventral hernia repair with multiple surgical mesh anchors. No abdominopelvic  free air or fluid. Musculoskeletal: No acute traumatic findings of the bony pelvis or imaged lumbar spine. Musculature is normal and symmetric. IMPRESSION: 1. No acute traumatic findings in the abdomen or pelvis. 2. Nonspecific intermediate attenuation within the gallbladder, can reflect biliary sludge. However, no visible evidence of calcified gallstones, features of acute cholecystitis or biliary ductal dilatation. If there is clinical concern, consider right upper quadrant ultrasound. 3. Hepatic steatosis 4. Nonobstructing bilateral nephrolithiasis. No obstructive urolithiasis or hydronephrosis. 5. Postsurgical changes likely reflecting prior hiatal hernia repair with small residual sliding-type hiatal hernia. 6. Moderate colonic stool burden with fecalized distal small bowel contents, correlate for slowed intestinal transit/constipation. Electronically Signed   By: Kreg Shropshire M.D.   On: 03/18/2021 16:41   CT Head Wo Contrast  Result Date: 03/18/2021 CLINICAL DATA:  Seizure, fall EXAM: CT HEAD WITHOUT CONTRAST TECHNIQUE: Contiguous axial images were obtained from the base of the skull through the vertex without intravenous contrast. COMPARISON:  None. FINDINGS: Brain: No evidence of acute infarction, hemorrhage, hydrocephalus, extra-axial collection, visible mass lesion or mass effect. Vascular: No hyperdense vessel or unexpected calcification. Skull: No calvarial fracture or suspicious osseous lesion. No scalp swelling or hematoma. Sinuses/Orbits: Paranasal sinuses and mastoid air cells are predominantly clear. Included orbital structures are unremarkable. Other: None. IMPRESSION: No acute intracranial abnormality. Electronically Signed   By: Kreg Shropshire M.D.   On: 03/18/2021 16:42   DG Chest Portable 1 View  Result Date: 03/18/2021 CLINICAL DATA:  50 year old male with cough. EXAM: PORTABLE CHEST 1 VIEW COMPARISON:  None. FINDINGS: The heart size and mediastinal contours are within normal limits.  Both lungs are clear. The visualized skeletal structures are unremarkable. IMPRESSION: No active disease. Electronically Signed   By: Elgie Collard M.D.   On: 03/18/2021 20:53   US Abdomen Limited RUQ (LIVER/GB)  Result Date: 03/18/2021 CLINICAL DATA:  Right upper quadrant pain EXAM: ULTRASOUND ABDOMEN LIMITED RIGHT UPPER QUADRANT COMPARISON:  CT from earlier in the same day. FINDINGS: Gallbladder: Gallbladder is well distended with sludge within. No gallstones are identified. No wall thickening or pericholecystic fluid is noted. Common bile duct: Diameter: 3.5 mm. Liver: Increased in echogenicity consistent with fatty infiltration. Portal vein is patent on color Doppler imaging with normal direction of blood flow towards the liver. Other: None. IMPRESSION: Fatty liver similar to that seen on prior CT. Gallbladder well distended with sludge. Electronically Signed   By: Alcide Clever M.D.   On: 03/18/2021 21:29        Scheduled Meds: . alum & mag hydroxide-simeth  30 mL Oral Once   And  . lidocaine  15 mL Oral Once  . chlordiazePOXIDE  25 mg Oral QID   Followed by  . [  START ON 03/20/2021] chlordiazePOXIDE  25 mg Oral TID   Followed by  . [START ON 03/21/2021] chlordiazePOXIDE  25 mg Oral BH-qamhs   Followed by  . [START ON 03/22/2021] chlordiazePOXIDE  25 mg Oral Daily  . doxycycline  100 mg Oral Q12H  . enoxaparin (LOVENOX) injection  40 mg Subcutaneous Q24H  . folic acid  1 mg Oral Daily  . multivitamin with minerals  1 tablet Oral Daily  . nicotine  14 mg Transdermal Daily  . sodium chloride flush  3 mL Intravenous Q12H  . tamsulosin  0.4 mg Oral Daily  . thiamine  100 mg Oral Daily   Or  . thiamine  100 mg Intravenous Daily   Continuous Infusions:   LOS: 0 days    Time spent: 40 min    Silvano Bilis, MD [facility]   If 7PM-7AM, please contact night-coverage [email or web address] Password [email or web address] 03/19/2021, 8:08 AM

## 2021-03-19 NOTE — ED Notes (Signed)
Called floor since no floor nurse currently assigned to pt.

## 2021-03-19 NOTE — Consult Note (Signed)
[facility] Face-to-Face Psychiatry Consult   Reason for Consult:  Alcohol withdrawal Referring Physician:  EDP Patient Identification: Isaac Jackson MRN:  [id number] Principal Diagnosis: Alcohol use with intoxication with complication [facility]) Diagnosis:  Principal Problem:   Alcohol use with intoxication with complication (HCC) Active Problems:   Alcohol withdrawal seizure with delirium (HCC)   Alcohol use disorder, severe, dependence (HCC)   HTN (hypertension)   BPH (benign prostatic hyperplasia)   Total Time spent with patient: 45 minutes  Subjective:   Isaac Jackson is a 50 y.o. male patient stated,   HPI:  50 y/o male presents to ER with abdominal pain, nausea, vomiting, Alcohol withdrawal, seizure.  He reports 20 year history of alcohol use, drinks "3-5 mixed drinks" per day, he reports "mixed drinks" as hard liquor. Currently he works as a TEFL teacher in a Administrator, [employer] job and has 3 weeks left, originally from [place] and plans to go back after contract over. His support system is "mom" who lives in [place]. He is currently in [employer] in [place]. He was in rehab 1-2 years ago, sobriety was for "little while" and he relapsed "stressful things". He declines to expand on current stressors. He reports history of seizures related to Alcohol use. He has good insight and in contemplation stage of change. He is interested in Rehab and counseling services, plans to do so in [place], not interested in rehab until he moves. Denies suicidal ideation/homicidal ideation/hallucinations/delusions/mania during encounter today. He feels safe in his environment.  Past Psychiatric History: alcohol use d/o  Risk to Self:  none Risk to Others:  none Prior Inpatient Therapy:  rehab x 1, 2 years ago Prior Outpatient Therapy:  [facility] in [place]   Past Medical History:  Past Medical History:  Diagnosis Date  . Asthma   . History of alcohol abuse    patient reports that he is an alcoholic and  goes to AA  . Hypertension   . Kidney stone   . Renal disorder     Past Surgical History:  Procedure Laterality Date  . ABDOMINAL SURGERY    . APPENDECTOMY    . HERNIA REPAIR     Family History:  Family History  Problem Relation Age of Onset  . Diabetes Mother   . Prostate cancer Father   . Alcohol abuse Father    Family Psychiatric  History: see above Social History:  Social History   Substance and Sexual Activity  Alcohol Use Yes   Comment: daily     Social History   Substance and Sexual Activity  Drug Use Never    Social History   Socioeconomic History  . Marital status: Single    Spouse name: Not on file  . Number of children: Not on file  . Years of education: Not on file  . Highest education level: Not on file  Occupational History  . Not on file  Tobacco Use  . Smoking status: Current Some Day Smoker    Types: Cigarettes  . Smokeless tobacco: Never Used  Vaping Use  . Vaping Use: Never used  Substance and Sexual Activity  . Alcohol use: Yes    Comment: daily  . Drug use: Never  . Sexual activity: Not on file  Other Topics Concern  . Not on file  Social History Narrative  . Not on file   Social Determinants of Health   Financial Resource Strain: Not on file  Food Insecurity: Not on file  Transportation Needs: Not on file  Physical  Activity: Not on file  Stress: Not on file  Social Connections: Not on file   Additional Social History:    Allergies:  No Known Allergies  Labs:  Results for orders placed or performed during the hospital encounter of 03/18/21 (from the past 48 hour(s))  Basic metabolic panel - if new onset seizures     Status: Abnormal   Collection Time: 03/18/21  2:46 PM  Result Value Ref Range   Sodium 136 135 - 145 mmol/L   Potassium 3.4 (L) 3.5 - 5.1 mmol/L   Chloride 95 (L) 98 - 111 mmol/L   CO2 26 22 - 32 mmol/L   Glucose, Bld 111 (H) 70 - 99 mg/dL    Comment: Glucose reference range applies only to samples taken  after fasting for at least 8 hours.   BUN 12 6 - 20 mg/dL   Creatinine, Ser 8.29 0.61 - 1.24 mg/dL   Calcium 9.5 8.9 - 56.2 mg/dL   GFR, Estimated >13 >08 mL/min    Comment: (NOTE) Calculated using the CKD-EPI Creatinine Equation (2021)    Anion gap 15 5 - 15    Comment: Performed at [facility], [address] [place], [place] [address]  CBC - if new onset seizures     Status: Abnormal   Collection Time: 03/18/21  2:46 PM  Result Value Ref Range   WBC 8.4 4.0 - 10.5 K/uL   RBC 5.55 4.22 - 5.81 MIL/uL   Hemoglobin 17.9 (H) 13.0 - 17.0 g/dL   HCT 69.6 29.5 - 28.4 %   MCV 91.0 80.0 - 100.0 fL   MCH 32.3 26.0 - 34.0 pg   MCHC 35.4 30.0 - 36.0 g/dL   RDW 13.2 44.0 - 10.2 %   Platelets 236 150 - 400 K/uL   nRBC 0.0 0.0 - 0.2 %    Comment: Performed at [facility], [address], [place], [place] [address]  Ethanol     Status: Abnormal   Collection Time: 03/18/21  3:37 PM  Result Value Ref Range   Alcohol, Ethyl (B) 165 (H) <10 mg/dL    Comment: (NOTE) Lowest detectable limit for serum alcohol is 10 mg/dL.  For medical purposes only. Performed at [facility], [address], [place], [place] [address]   Magnesium     Status: None   Collection Time: 03/18/21  3:37 PM  Result Value Ref Range   Magnesium 2.1 1.7 - 2.4 mg/dL    Comment: Performed at [facility], [address] [place], [place] [address]  Lipase, blood     Status: None   Collection Time: 03/18/21  3:37 PM  Result Value Ref Range   Lipase 30 11 - 51 U/L    Comment: Performed at [facility], [address] [place], [place] [address]  Hepatic function panel     Status: None   Collection Time: 03/18/21  3:37 PM  Result Value Ref Range   Total Protein 8.1 6.5 - 8.1 g/dL   Albumin 4.7 3.5 - 5.0 g/dL   AST 24 15 - 41 U/L   ALT 20 0 - 44 U/L   Alkaline Phosphatase 63 38 - 126 U/L   Total Bilirubin 0.9 0.3 - 1.2 mg/dL   Bilirubin, Direct 0.2 0.0 - 0.2 mg/dL    Indirect Bilirubin 0.7 0.3 - 0.9 mg/dL    Comment: Performed at [facility], [address], [place], [place] [address]  Resp Panel by RT-PCR (Flu  A&B, Covid)     Status: None   Collection Time: 03/18/21  9:11 PM  Result Value Ref Range   SARS Coronavirus 2 by RT PCR NEGATIVE NEGATIVE    Comment: (NOTE) SARS-CoV-2 target nucleic acids are NOT DETECTED.  The SARS-CoV-2 RNA is generally detectable in upper respiratory specimens during the acute phase of infection. The lowest concentration of SARS-CoV-2 viral copies this assay can detect is 138 copies/mL. A negative result does not preclude SARS-Cov-2 infection and should not be used as the sole basis for treatment or other patient management decisions. A negative result may occur with  improper specimen collection/handling, submission of specimen other than nasopharyngeal swab, presence of viral mutation(s) within the areas targeted by this assay, and inadequate number of viral copies(<138 copies/mL). A negative result must be combined with clinical observations, patient history, and epidemiological information. The expected result is Negative.  Fact Sheet for Patients:  BloggerCourse.com  Fact Sheet for Healthcare Providers:  SeriousBroker.it  This test is no t yet approved or cleared by the [place] FDA and  has been authorized for detection and/or diagnosis of SARS-CoV-2 by FDA under an Emergency Use Authorization (EUA). This EUA will remain  in effect (meaning this test can be used) for the duration of the COVID-19 declaration under Section 564(b)(1) of the Act, 21 U.S.C.section 360bbb-3(b)(1), unless the authorization is terminated  or revoked sooner.       Influenza A by PCR NEGATIVE NEGATIVE   Influenza B by PCR NEGATIVE NEGATIVE    Comment: (NOTE) The Xpert Xpress SARS-CoV-2/FLU/RSV plus assay is intended as an aid in the diagnosis of influenza from  Nasopharyngeal swab specimens and should not be used as a sole basis for treatment. Nasal washings and aspirates are unacceptable for Xpert Xpress SARS-CoV-2/FLU/RSV testing.  Fact Sheet for Patients: BloggerCourse.com  Fact Sheet for Healthcare Providers: SeriousBroker.it  This test is not yet approved or cleared by the [place] FDA and has been authorized for detection and/or diagnosis of SARS-CoV-2 by FDA under an Emergency Use Authorization (EUA). This EUA will remain in effect (meaning this test can be used) for the duration of the COVID-19 declaration under Section 564(b)(1) of the Act, 21 U.S.C. section 360bbb-3(b)(1), unless the authorization is terminated or revoked.  Performed at [facility], [address] [place], [place] [address]   CBC     Status: Abnormal   Collection Time: 03/19/21  5:18 AM  Result Value Ref Range   WBC 6.3 4.0 - 10.5 K/uL   RBC 4.01 (L) 4.22 - 5.81 MIL/uL   Hemoglobin 13.2 13.0 - 17.0 g/dL   HCT 82.4 (L) 23.5 - 36.1 %   MCV 92.8 80.0 - 100.0 fL   MCH 32.9 26.0 - 34.0 pg   MCHC 35.5 30.0 - 36.0 g/dL   RDW 44.3 15.4 - 00.8 %   Platelets 160 150 - 400 K/uL   nRBC 0.0 0.0 - 0.2 %    Comment: Performed at [facility], [address], [place], [place] [address]  Basic metabolic panel     Status: Abnormal   Collection Time: 03/19/21  5:18 AM  Result Value Ref Range   Sodium 137 135 - 145 mmol/L   Potassium 5.3 (H) 3.5 - 5.1 mmol/L   Chloride 105 98 - 111 mmol/L   CO2 25 22 - 32 mmol/L   Glucose, Bld 121 (H) 70 - 99 mg/dL    Comment: Glucose reference range applies only to samples taken after  fasting for at least 8 hours.   BUN 15 6 - 20 mg/dL   Creatinine, Ser 1.47 0.61 - 1.24 mg/dL   Calcium 8.2 (L) 8.9 - 10.3 mg/dL   GFR, Estimated >82 >95 mL/min    Comment: (NOTE) Calculated using the CKD-EPI Creatinine Equation (2021)    Anion gap 7 5 - 15    Comment:  Performed at [facility], [address], [place], [place] [address]    Current Facility-Administered Medications  Medication Dose Route Frequency Provider Last Rate Last Admin  . acetaminophen (TYLENOL) tablet 650 mg  650 mg Oral Q6H PRN Charlsie Quest, MD       Or  . acetaminophen (TYLENOL) suppository 650 mg  650 mg Rectal Q6H PRN Darreld Mclean R, MD      . albuterol (VENTOLIN HFA) 108 (90 Base) MCG/ACT inhaler 1-2 puff  1-2 puff Inhalation Q6H PRN Shaune Pollack, MD      . alum & mag hydroxide-simeth (MAALOX/MYLANTA) 200-200-20 MG/5ML suspension 30 mL  30 mL Oral Q6H PRN Shaune Pollack, MD      . alum & mag hydroxide-simeth (MAALOX/MYLANTA) 200-200-20 MG/5ML suspension 30 mL  30 mL Oral Once Shaune Pollack, MD       And  . lidocaine (XYLOCAINE) 2 % viscous mouth solution 15 mL  15 mL Oral Once Shaune Pollack, MD      . chlordiazePOXIDE (LIBRIUM) capsule 25 mg  25 mg Oral Q6H PRN Darreld Mclean R, MD      . chlordiazePOXIDE (LIBRIUM) capsule 25 mg  25 mg Oral QID Charlsie Quest, MD   25 mg at 03/18/21 2353   Followed by  . [START ON 03/20/2021] chlordiazePOXIDE (LIBRIUM) capsule 25 mg  25 mg Oral TID Charlsie Quest, MD       Followed by  . [START ON 03/21/2021] chlordiazePOXIDE (LIBRIUM) capsule 25 mg  25 mg Oral BH-qamhs Charlsie Quest, MD       Followed by  . [START ON 03/22/2021] chlordiazePOXIDE (LIBRIUM) capsule 25 mg  25 mg Oral Daily Patel, Vishal R, MD      . doxycycline (VIBRA-TABS) tablet 100 mg  100 mg Oral Q12H Shaune Pollack, MD      . enoxaparin (LOVENOX) injection 40 mg  40 mg Subcutaneous Q24H Charlsie Quest, MD   40 mg at 03/18/21 2355  . folic acid (FOLVITE) tablet 1 mg  1 mg Oral Daily Darreld Mclean R, MD      . hydrOXYzine (ATARAX/VISTARIL) tablet 25 mg  25 mg Oral Q6H PRN Darreld Mclean R, MD      . lactated ringers infusion   Intravenous Continuous Wouk, Wilfred Curtis, MD      . loperamide (IMODIUM) capsule 2-4 mg  2-4 mg Oral PRN Charm Rings, NP       . LORazepam (ATIVAN) tablet 1-4 mg  1-4 mg Oral Q1H PRN Charlsie Quest, MD       Or  . LORazepam (ATIVAN) injection 1-4 mg  1-4 mg Intravenous Q1H PRN Darreld Mclean R, MD   4 mg at 03/19/21 1013  . multivitamin with minerals tablet 1 tablet  1 tablet Oral Daily Patel, Vishal R, MD      . nicotine (NICODERM CQ - dosed in mg/24 hours) patch 14 mg  14 mg Transdermal Daily Charlsie Quest, MD   14 mg at 03/18/21 2355  . ondansetron (ZOFRAN) injection 4 mg  4 mg Intravenous Q6H PRN Charlsie Quest, MD  Or  . ondansetron (ZOFRAN) tablet 4 mg  4 mg Oral Q6H PRN Charlsie QuestPatel, Vishal R, MD      . senna-docusate (Senokot-S) tablet 2 tablet  2 tablet Oral QHS PRN Darreld McleanPatel, Vishal R, MD      . sodium chloride flush (NS) 0.9 % injection 3 mL  3 mL Intravenous Q12H Darreld McleanPatel, Vishal R, MD   3 mL at 03/18/21 2358  . tamsulosin (FLOMAX) capsule 0.4 mg  0.4 mg Oral Daily Shaune PollackIsaacs, Cameron, MD   0.4 mg at 03/18/21 1951  . thiamine tablet 100 mg  100 mg Oral Daily Darreld McleanPatel, Vishal R, MD       Or  . thiamine (B-1) injection 100 mg  100 mg Intravenous Daily Charlsie QuestPatel, Vishal R, MD       Current Outpatient Medications  Medication Sig Dispense Refill  . albuterol (VENTOLIN HFA) 108 (90 Base) MCG/ACT inhaler Inhale 1-2 puffs into the lungs every 6 (six) hours as needed.    . folic acid (FOLVITE) 1 MG tablet Take 1 tablet (1 mg total) by mouth daily. 30 tablet 0  . hydrochlorothiazide (MICROZIDE) 12.5 MG capsule Take 12.5 mg by mouth daily.    Marland Kitchen. losartan (COZAAR) 100 MG tablet Take 100 mg by mouth daily.    . Multiple Vitamin (MULTIVITAMIN WITH MINERALS) TABS tablet Take 1 tablet by mouth daily.    . ondansetron (ZOFRAN-ODT) 4 MG disintegrating tablet Take 1 tablet (4 mg total) by mouth every 6 (six) hours as needed for nausea or vomiting. 20 tablet 0  . polyethylene glycol (MIRALAX / GLYCOLAX) 17 g packet Take 17 g by mouth 2 (two) times daily. 30 each 0  . senna-docusate (SENOKOT-S) 8.6-50 MG tablet Take 2 tablets by mouth 2  (two) times daily. 60 tablet 1  . tamsulosin (FLOMAX) 0.4 MG CAPS capsule Take 0.4 mg by mouth daily.    Marland Kitchen. thiamine 100 MG tablet Take 1 tablet (100 mg total) by mouth daily. 30 tablet 0  . hydrOXYzine (VISTARIL) 25 MG capsule Take 25 mg by mouth 3 (three) times daily as needed. (Patient not taking: Reported on 03/18/2021)      Musculoskeletal: Strength & Muscle Tone: decreased Gait & Station: normal Patient leans: N/A  Psychiatric Specialty Exam: Physical Exam Vitals and nursing note reviewed.  Constitutional:      Appearance: Normal appearance.  HENT:     Head: Normocephalic.     Nose: Nose normal.  Pulmonary:     Effort: Pulmonary effort is normal.  Musculoskeletal:        General: Normal range of motion.     Cervical back: Normal range of motion.  Neurological:     General: No focal deficit present.     Mental Status: He is alert and oriented to person, place, and time.  Psychiatric:        Attention and Perception: Attention and perception normal.        Mood and Affect: Mood is anxious and depressed.        Speech: Speech normal.        Behavior: Behavior normal. Behavior is cooperative.        Thought Content: Thought content normal.        Cognition and Memory: Cognition and memory normal.        Judgment: Judgment normal.     Review of Systems  Psychiatric/Behavioral: Positive for depression and substance abuse. The patient is nervous/anxious.   All other systems reviewed and are negative.  Blood pressure 122/76, pulse 83, temperature 99.7 F (37.6 C), temperature source Oral, resp. rate (!) 21, height  (1.702 m), weight 63.5 kg, SpO2 100 %.Body mass index is 21.93 kg/m.  General Appearance: Disheveled  Eye Contact:  Fair  Speech:  Normal Rate  Volume:  Normal  Mood:  Anxious and Depressed  Affect:  Congruent  Thought Process:  Coherent and Descriptions of Associations: Intact  Orientation:  Full (Time, Place, and Person)  Thought Content:  WDL and  Logical  Suicidal Thoughts:  No  Homicidal Thoughts:  No  Memory:  Immediate;   Good Recent;   Good Remote;   Good  Judgement:  Poor  Insight:  Fair  Psychomotor Activity:  Decreased  Concentration:  Concentration: Fair and Attention Span: Fair  Recall:  Fair  Fund of Knowledge:  Good  Language:  Good  Akathisia:  No  Handed:  Right  AIMS (if indicated):     Assets:  Housing Leisure Time Physical Health Resilience Social Support Vocational/Educational  ADL's:  Intact  Cognition:  WNL  Sleep:      Physical Exam: Physical Exam Vitals and nursing note reviewed.  Constitutional:      Appearance: Normal appearance.  HENT:     Head: Normocephalic.     Nose: Nose normal.  Pulmonary:     Effort: Pulmonary effort is normal.  Musculoskeletal:        General: Normal range of motion.     Cervical back: Normal range of motion.  Neurological:     General: No focal deficit present.     Mental Status: He is alert and oriented to person, place, and time.  Psychiatric:        Attention and Perception: Attention and perception normal.        Mood and Affect: Mood is anxious and depressed.        Speech: Speech normal.        Behavior: Behavior normal. Behavior is cooperative.        Thought Content: Thought content normal.        Cognition and Memory: Cognition and memory normal.        Judgment: Judgment normal.    Review of Systems  Psychiatric/Behavioral: Positive for depression and substance abuse. The patient is nervous/anxious.   All other systems reviewed and are negative.  Blood pressure 122/76, pulse 83, temperature 99.7 F (37.6 C), temperature source Oral, resp. rate (!) 21, height  (1.702 m), weight 63.5 kg, SpO2 100 %. Body mass index is 21.93 kg/m.  Treatment Plan Summary: Alcohol withdrawal with complications: -Continue Ativan alcohol detox protocol -Refrain from alcohol and drug use -Recommend rehab which he declined here, he plans to pursue when he  returns to [place] in [redacted] weeks along with AA to continue his sobriety.  Disposition: No evidence of imminent risk to self or others at present.   Patient does not meet criteria for psychiatric inpatient admission. Supportive therapy provided about ongoing stressors.  Nanine Means, NP 03/19/2021 10:46 AM

## 2021-03-19 NOTE — ED Notes (Signed)
Psych at bedside with pt- informed RN that pt was very shaky and needed some ativan

## 2021-03-19 NOTE — ED Notes (Signed)
Pt resting in bed with eyes closed and even repsirations

## 2021-03-19 NOTE — ED Notes (Signed)
Floor RN GK given report.

## 2021-03-19 NOTE — ED Notes (Signed)
Handoff completed with AG and HU floor RNs.

## 2021-03-19 NOTE — ED Notes (Signed)
Handoff completed with floor RN AG.

## 2021-03-19 NOTE — ED Notes (Signed)
Pt given soda as requested. Pt ate some from all meal trays today.

## 2021-03-19 NOTE — ED Notes (Signed)
Pt asleep; laying on L side; snoring. Stretcher locked low. Rail up. Call bell within reach.

## 2021-03-19 NOTE — ED Notes (Signed)
Pt's IV pump alarming d/t bending arm while sleeping. Fixed.

## 2021-03-19 NOTE — ED Notes (Signed)
New IV placed at pt's request since fluids keep pausing when he bends his arm causing pump alarm to go off.

## 2021-03-20 LAB — BASIC METABOLIC PANEL
Anion gap: 6 (ref 5–15)
BUN: 12 mg/dL (ref 6–20)
CO2: 27 mmol/L (ref 22–32)
Calcium: 8.2 mg/dL — ABNORMAL LOW (ref 8.9–10.3)
Chloride: 107 mmol/L (ref 98–111)
Creatinine, Ser: 0.79 mg/dL (ref 0.61–1.24)
GFR, Estimated: >60 mL/min (ref >60)
Glucose, Bld: 103 mg/dL — ABNORMAL HIGH (ref 70–99)
Potassium: 3.6 mmol/L (ref 3.5–5.1)
Sodium: 140 mmol/L (ref 135–145)

## 2021-03-20 LAB — HEPATITIS B SURFACE ANTIGEN: Hepatitis B Surface Ag: NONREACTIVE

## 2021-03-20 LAB — HEPATITIS C ANTIBODY: HCV Ab: NONREACTIVE

## 2021-03-20 LAB — MAGNESIUM: Magnesium: 2 mg/dL (ref 1.7–2.4)

## 2021-03-20 LAB — PHOSPHORUS: Phosphorus: 2.6 mg/dL (ref 2.5–4.6)

## 2021-03-20 MED ORDER — METOCLOPRAMIDE HCL 5 MG/ML IJ SOLN
10.0000 mg | Freq: Four times a day (QID) | INTRAMUSCULAR | Status: DC | PRN
  Administered 2021-03-20 (×2): 10 mg via INTRAVENOUS
  Filled 2021-03-20 (×2): qty 2

## 2021-03-20 NOTE — Progress Notes (Signed)
PROGRESS NOTE    Isaac Jackson  ZOX:[id number] DOB: 07-15-1971 DOA: 03/18/2021 PCP: Pcp, No  Outpatient Specialists: none    Brief Narrative:   Isaac Jackson is a 50 y.o. male with medical history significant for alcohol use disorder with prior history of withdrawal and alcohol associated pancreatitis, hypertension, history of renal stones, depression/anxiety, tobacco use who presents to the ED for evaluation of epigastric pain, nausea, vomiting.  Patient reports chronic alcohol use, 3-5 liquor drinks per day.  He reported higher daily intake to the emergency provider.  He says over the last 3 days he has been having frequent nausea with vomiting.  He has had nonradiating epigastric pain.  He has had some chills and diaphoresis.  He says his last alcoholic drink was yesterday 5/26.  He has been having tremors today.  He reports decreased urine output than expected.  Per ED triage notes, [facility] due to staff reporting "seizure-like activity."  He reportedly fell today as well without any significant injury.  EMS noted that patient kept "flopping" out of the wheelchair.  This was reported as seizure-like activity but patient did not have any loss of consciousness or postictal phase.  Patient also reports erythema and pain at his right lateral ankle area.  He says the area was pruritic and he has been scratching at it.  He saw some discharge from the area earlier.  Patient reports ongoing tobacco use, less than half pack per day.  He denies any cocaine or IV drug use.  He says the only medications he has been taking her losartan 100 mg, HCTZ 12.5 mg, and Flomax but has not taken any of these in the last week.   Assessment & Plan:   Principal Problem:   Alcohol use with intoxication with complication (HCC) Active Problems:   HTN (hypertension)   BPH (benign prostatic hyperplasia)   Alcohol withdrawal seizure with delirium  (HCC)   Alcohol use disorder, severe, dependence (HCC)   Alcohol withdrawal (HCC)   # Alcohol dependence # Alcohol withdrawal # History complicated withdrawal Last drink 5/26, etoh level elevated on arrival. Reports hx etoh withdrawal seizure. Reports current symptoms typical for his withdrawal. Nausea/vomiting improved. Remains tremulous - librium taper, ciwa ativan protocol - hydroxyzine prn - tolerating diet, will d/c fluids - seizure precautions - thiamine/folate - daily mg, phos - f/u hepatitis panel  # Hypertension: Has been off home losartan and HCTZ for 1 week. bp wnl - monitor  # Hypokalemia: Hyperkalemic w/ ivf with k, stopped that, now k wnl - monitor  # Right lateral ankle cellulitis No signs abscess. Stable - cont doxy  # Hepatic steatosis: Seen on CT imaging, likely related to ongoing alcohol use.  # Depression/anxiety: Not currently on medical therapy.  # BPH: Continue Flomax.  # Tobacco use: Nicotine patch ordered.   DVT prophylaxis: lovenox Code Status: full Family Communication: none @ bedside. Mother is primary contact. Pt requests we not contact her  Level of care: Med-Surg Status is: inpt  The patient will require care spanning > 2 midnights and should be moved to inpatient because: Inpatient level of care appropriate due to severity of illness  Dispo: The patient is from: Home              Anticipated d/c is to: Home              Patient currently is not medically stable to d/c.  Difficult to place patient No        Consultants:  none  Procedures: none  Antimicrobials:  none    Subjective: This morning tremulous, eating breakfast  Objective: Vitals:   03/19/21 2100 03/19/21 2125 03/20/21 0525 03/20/21 0847  BP: 120/78 (!) 136/93 (!) 121/93 122/84  Pulse: 86 87 72 76  Resp:  19 18 18   Temp:  98.1 F (36.7 C) 97.8 F (36.6 C) 97.9 F (36.6 C)  TempSrc:  Oral Oral   SpO2: 96% 100% 100% 100%  Weight:  67.6  kg    Height:  5\' 7"  (1.702 m)      Intake/Output Summary (Last 24 hours) at 03/20/2021 0907 Last data filed at 03/20/2021 1610 Gross per 24 hour  Intake 1508.06 ml  Output 1625 ml  Net -116.94 ml   Filed Weights   03/18/21 1415 03/19/21 2125  Weight: 63.5 kg 67.6 kg    Examination:  General exam: Appears anxious Respiratory system: Clear to auscultation. Respiratory effort normal. Cardiovascular system: S1 & S2 heard, RRR. No JVD, murmurs, rubs, gallops or clicks. No pedal edema. Gastrointestinal system: Abdomen is nondistended, soft and nontender. No organomegaly or masses felt. Normal bowel sounds heard. Central nervous system: Alert and oriented. No focal neurological deficits. tremor Extremities: Symmetric 5 x 5 power. Skin: No rashes, lesions or ulcers Psychiatry: Judgement and insight appear normal.     Data Reviewed: I have personally reviewed following labs and imaging studies  CBC: Recent Labs  Lab 03/18/21 1446 03/19/21 0518  WBC 8.4 6.3  HGB 17.9* 13.2  HCT 50.5 37.2*  MCV 91.0 92.8  PLT 236 160   Basic Metabolic Panel: Recent Labs  Lab 03/18/21 1446 03/18/21 1537 03/19/21 0518 03/20/21 0452  NA 136  --  137 140  K 3.4*  --  5.3* 3.6  CL 95*  --  105 107  CO2 26  --  25 27  GLUCOSE 111*  --  121* 103*  BUN 12  --  15 12  CREATININE 1.05  --  1.04 0.79  CALCIUM 9.5  --  8.2* 8.2*  MG  --  2.1  --  2.0  PHOS  --   --   --  2.6   GFR: Estimated Creatinine Clearance: 104.4 mL/min (by C-G formula based on SCr of 0.79 mg/dL). Liver Function Tests: Recent Labs  Lab 03/18/21 1537  AST 24  ALT 20  ALKPHOS 63  BILITOT 0.9  PROT 8.1  ALBUMIN 4.7   Recent Labs  Lab 03/18/21 1537  LIPASE 30   No results for input(s): AMMONIA in the last 168 hours. Coagulation Profile: No results for input(s): INR, PROTIME in the last 168 hours. Cardiac Enzymes: No results for input(s): CKTOTAL, CKMB, CKMBINDEX, TROPONINI in the last 168 hours. BNP (last  3 results) No results for input(s): PROBNP in the last 8760 hours. HbA1C: No results for input(s): HGBA1C in the last 72 hours. CBG: No results for input(s): GLUCAP in the last 168 hours. Lipid Profile: No results for input(s): CHOL, HDL, LDLCALC, TRIG, CHOLHDL, LDLDIRECT in the last 72 hours. Thyroid Function Tests: No results for input(s): TSH, T4TOTAL, FREET4, T3FREE, THYROIDAB in the last 72 hours. Anemia Panel: No results for input(s): VITAMINB12, FOLATE, FERRITIN, TIBC, IRON, RETICCTPCT in the last 72 hours. Urine analysis:    Component Value Date/Time   COLORURINE YELLOW (A) 02/21/2021 1345   APPEARANCEUR CLEAR (A) 02/21/2021 1345   LABSPEC 1.005 02/21/2021 1345   PHURINE 7.0  02/21/2021 1345   GLUCOSEU NEGATIVE 02/21/2021 1345   HGBUR MODERATE (A) 02/21/2021 1345   BILIRUBINUR NEGATIVE 02/21/2021 1345   KETONESUR NEGATIVE 02/21/2021 1345   PROTEINUR NEGATIVE 02/21/2021 1345   NITRITE NEGATIVE 02/21/2021 1345   LEUKOCYTESUR NEGATIVE 02/21/2021 1345   Sepsis Labs: @LABRCNTIP (procalcitonin:4,lacticidven:4)  ) Recent Results (from the past 240 hour(s))  Resp Panel by RT-PCR (Flu A&B, Covid) Nasopharyngeal Swab     Status: Abnormal   Collection Time: 03/18/21  8:18 PM   Specimen: Nasopharyngeal Swab; Nasopharyngeal(NP) swabs in vial transport medium  Result Value Ref Range Status   SARS Coronavirus 2 by RT PCR TEST WILL BE CREDITED (A) NEGATIVE Final   Influenza A by PCR TEST WILL BE CREDITED (A) NEGATIVE Final   Influenza B by PCR TEST WILL BE CREDITED (A) NEGATIVE Final    Comment: (NOTE) The Xpert Xpress SARS-CoV-2/FLU/RSV plus assay is intended as an aid in the diagnosis of influenza from Nasopharyngeal swab specimens and should not be used as a sole basis for treatment. Nasal washings and aspirates are unacceptable for Xpert Xpress SARS-CoV-2/FLU/RSV testing.  Fact Sheet for Patients: BloggerCourse.com  Fact Sheet for Healthcare  Providers: SeriousBroker.it  This test is not yet approved or cleared by the [place] FDA and has been authorized for detection and/or diagnosis of SARS-CoV-2 by FDA under an Emergency Use Authorization (EUA). This EUA will remain in effect (meaning this test can be used) for the duration of the COVID-19 declaration under Section 564(b)(1) of the Act, 21 U.S.C. section 360bbb-3(b)(1), unless the authorization is terminated or revoked.  Performed at [facility], [address] [place], [place] [address]   Resp Panel by RT-PCR (Flu A&B, Covid)     Status: None   Collection Time: 03/18/21  9:11 PM  Result Value Ref Range Status   SARS Coronavirus 2 by RT PCR NEGATIVE NEGATIVE Final    Comment: (NOTE) SARS-CoV-2 target nucleic acids are NOT DETECTED.  The SARS-CoV-2 RNA is generally detectable in upper respiratory specimens during the acute phase of infection. The lowest concentration of SARS-CoV-2 viral copies this assay can detect is 138 copies/mL. A negative result does not preclude SARS-Cov-2 infection and should not be used as the sole basis for treatment or other patient management decisions. A negative result may occur with  improper specimen collection/handling, submission of specimen other than nasopharyngeal swab, presence of viral mutation(s) within the areas targeted by this assay, and inadequate number of viral copies(<138 copies/mL). A negative result must be combined with clinical observations, patient history, and epidemiological information. The expected result is Negative.  Fact Sheet for Patients:  BloggerCourse.com  Fact Sheet for Healthcare Providers:  SeriousBroker.it  This test is no t yet approved or cleared by the [place] FDA and  has been authorized for detection and/or diagnosis of SARS-CoV-2 by FDA under an Emergency Use Authorization (EUA). This EUA will  remain  in effect (meaning this test can be used) for the duration of the COVID-19 declaration under Section 564(b)(1) of the Act, 21 U.S.C.section 360bbb-3(b)(1), unless the authorization is terminated  or revoked sooner.       Influenza A by PCR NEGATIVE NEGATIVE Final   Influenza B by PCR NEGATIVE NEGATIVE Final    Comment: (NOTE) The Xpert Xpress SARS-CoV-2/FLU/RSV plus assay is intended as an aid in the diagnosis of influenza from Nasopharyngeal swab specimens and should not be used as a sole basis for treatment. Nasal washings and aspirates are unacceptable for Xpert Xpress SARS-CoV-2/FLU/RSV  testing.  Fact Sheet for Patients: BloggerCourse.com  Fact Sheet for Healthcare Providers: SeriousBroker.it  This test is not yet approved or cleared by the [place] FDA and has been authorized for detection and/or diagnosis of SARS-CoV-2 by FDA under an Emergency Use Authorization (EUA). This EUA will remain in effect (meaning this test can be used) for the duration of the COVID-19 declaration under Section 564(b)(1) of the Act, 21 U.S.C. section 360bbb-3(b)(1), unless the authorization is terminated or revoked.  Performed at [facility], [address] [place], [place] [address]          Radiology Studies: CT ABDOMEN PELVIS WO CONTRAST  Result Date: 03/18/2021 CLINICAL DATA:  Abdominal pain, seizure, fall EXAM: CT ABDOMEN AND PELVIS WITHOUT CONTRAST TECHNIQUE: Multidetector CT imaging of the abdomen and pelvis was performed following the standard protocol without IV contrast. COMPARISON:  Renal ultrasound 02/22/2021, CT renal colic 02/21/2021 FINDINGS: Lower chest: Lung bases are clear. The no acute traumatic abnormality of the lower chest wall. Evaluation for rib fractures is limited by respiratory motion artifact. Hepatobiliary: No visible focal liver lesion or direct traumatic hepatic injury is identified. No  perihepatic hematoma. Diffuse hepatic hypoattenuation compatible with hepatic steatosis. Sparing along the gallbladder fossa. Intermediate attenuation within the gallbladder is nonspecific though can reflect biliary sludge. No pericholecystic fluid or inflammation. No visible calcified gallstones. No biliary ductal dilatation. Pancreas: Moderate pancreatic atrophy. No pancreatic ductal dilatation or surrounding inflammatory changes. Spleen: Normal in size. No concerning splenic lesions. No evidence of direct splenic injury or perisplenic hematoma. Adrenals/Urinary Tract: Normal adrenal glands without adrenal hemorrhage or suspicious lesion. Kidneys are symmetric in size and normally located. No convincing evidence of direct renal injury or perinephric hemorrhage. No worrisome visible or contour deforming renal lesions. Multiple bilateral nonobstructing renal calculi, largest is an 11 mm calculus in the upper pole left kidney. No obstructive urolithiasis or hydronephrosis. Urinary bladder is unremarkable. Stomach/Bowel: Postsurgical changes at the diaphragmatic hiatus likely from prior hiatal hernia repair with small recurrent hiatal hernia. No small bowel thickening or dilatation. Fecalized distal small bowel contents and a moderate colonic stool burden. No colonic dilatation or wall thickening. The appendix is surgically absent. No evidence of bowel obstruction. Vascular/Lymphatic: No significant vascular findings are present. No enlarged abdominal or pelvic lymph nodes. Reproductive: The prostate and seminal vesicles are unremarkable. Other: No large body wall or retroperitoneal hematoma. No traumatic abdominal wall dehiscence. Postsurgical changes from prior ventral hernia repair with multiple surgical mesh anchors. No abdominopelvic free air or fluid. Musculoskeletal: No acute traumatic findings of the bony pelvis or imaged lumbar spine. Musculature is normal and symmetric. IMPRESSION: 1. No acute traumatic  findings in the abdomen or pelvis. 2. Nonspecific intermediate attenuation within the gallbladder, can reflect biliary sludge. However, no visible evidence of calcified gallstones, features of acute cholecystitis or biliary ductal dilatation. If there is clinical concern, consider right upper quadrant ultrasound. 3. Hepatic steatosis 4. Nonobstructing bilateral nephrolithiasis. No obstructive urolithiasis or hydronephrosis. 5. Postsurgical changes likely reflecting prior hiatal hernia repair with small residual sliding-type hiatal hernia. 6. Moderate colonic stool burden with fecalized distal small bowel contents, correlate for slowed intestinal transit/constipation. Electronically Signed   By: Kreg Shropshire M.D.   On: 03/18/2021 16:41   CT Head Wo Contrast  Result Date: 03/18/2021 CLINICAL DATA:  Seizure, fall EXAM: CT HEAD WITHOUT CONTRAST TECHNIQUE: Contiguous axial images were obtained from the base of the skull through the vertex without intravenous contrast. COMPARISON:  None. FINDINGS: Brain: No  evidence of acute infarction, hemorrhage, hydrocephalus, extra-axial collection, visible mass lesion or mass effect. Vascular: No hyperdense vessel or unexpected calcification. Skull: No calvarial fracture or suspicious osseous lesion. No scalp swelling or hematoma. Sinuses/Orbits: Paranasal sinuses and mastoid air cells are predominantly clear. Included orbital structures are unremarkable. Other: None. IMPRESSION: No acute intracranial abnormality. Electronically Signed   By: Kreg Shropshire M.D.   On: 03/18/2021 16:42   DG Chest Portable 1 View  Result Date: 03/18/2021 CLINICAL DATA:  50 year old male with cough. EXAM: PORTABLE CHEST 1 VIEW COMPARISON:  None. FINDINGS: The heart size and mediastinal contours are within normal limits. Both lungs are clear. The visualized skeletal structures are unremarkable. IMPRESSION: No active disease. Electronically Signed   By: Elgie Collard M.D.   On: 03/18/2021 20:53    US Abdomen Limited RUQ (LIVER/GB)  Result Date: 03/18/2021 CLINICAL DATA:  Right upper quadrant pain EXAM: ULTRASOUND ABDOMEN LIMITED RIGHT UPPER QUADRANT COMPARISON:  CT from earlier in the same day. FINDINGS: Gallbladder: Gallbladder is well distended with sludge within. No gallstones are identified. No wall thickening or pericholecystic fluid is noted. Common bile duct: Diameter: 3.5 mm. Liver: Increased in echogenicity consistent with fatty infiltration. Portal vein is patent on color Doppler imaging with normal direction of blood flow towards the liver. Other: None. IMPRESSION: Fatty liver similar to that seen on prior CT. Gallbladder well distended with sludge. Electronically Signed   By: Alcide Clever M.D.   On: 03/18/2021 21:29        Scheduled Meds: . alum & mag hydroxide-simeth  30 mL Oral Once   And  . lidocaine  15 mL Oral Once  . chlordiazePOXIDE  25 mg Oral QID   Followed by  . chlordiazePOXIDE  25 mg Oral TID   Followed by  . [START ON 03/21/2021] chlordiazePOXIDE  25 mg Oral BH-qamhs   Followed by  . [START ON 03/22/2021] chlordiazePOXIDE  25 mg Oral Daily  . doxycycline  100 mg Oral Q12H  . enoxaparin (LOVENOX) injection  40 mg Subcutaneous Q24H  . folic acid  1 mg Oral Daily  . multivitamin with minerals  1 tablet Oral Daily  . nicotine  14 mg Transdermal Daily  . sodium chloride flush  3 mL Intravenous Q12H  . tamsulosin  0.4 mg Oral Daily  . thiamine  100 mg Oral Daily   Or  . thiamine  100 mg Intravenous Daily   Continuous Infusions: . lactated ringers 100 mL/hr at 03/20/21 0609     LOS: 1 day    Time spent: 30 min    Silvano Bilis, MD [facility]   If 7PM-7AM, please contact night-coverage [email or web address] Password [email or web address] 03/20/2021, 9:07 AM

## 2021-03-20 NOTE — Plan of Care (Signed)
  Problem: Clinical Measurements: Goal: Diagnostic test results will improve Outcome: Progressing   Problem: Education: Goal: Knowledge of General Education information will improve Description: Including pain rating scale, medication(s)/side effects and non-pharmacologic comfort measures Outcome: Progressing   Problem: Health Behavior/Discharge Planning: Goal: Ability to manage health-related needs will improve Outcome: Progressing   Problem: Clinical Measurements: Goal: Ability to maintain clinical measurements within normal limits will improve Outcome: Progressing Goal: Will remain free from infection Outcome: Progressing Goal: Diagnostic test results will improve Outcome: Progressing Goal: Respiratory complications will improve Outcome: Progressing Goal: Cardiovascular complication will be avoided Outcome: Progressing   Problem: Activity: Goal: Risk for activity intolerance will decrease Outcome: Progressing

## 2021-03-21 DIAGNOSIS — F10929 Alcohol use, unspecified with intoxication, unspecified: Secondary | ICD-10-CM | POA: Diagnosis not present

## 2021-03-21 LAB — MAGNESIUM: Magnesium: 2 mg/dL (ref 1.7–2.4)

## 2021-03-21 LAB — BASIC METABOLIC PANEL
Anion gap: 8 (ref 5–15)
BUN: 19 mg/dL (ref 6–20)
CO2: 24 mmol/L (ref 22–32)
Calcium: 8.4 mg/dL — ABNORMAL LOW (ref 8.9–10.3)
Chloride: 107 mmol/L (ref 98–111)
Creatinine, Ser: 0.89 mg/dL (ref 0.61–1.24)
GFR, Estimated: >60 mL/min (ref >60)
Glucose, Bld: 114 mg/dL — ABNORMAL HIGH (ref 70–99)
Potassium: 3.3 mmol/L — ABNORMAL LOW (ref 3.5–5.1)
Sodium: 139 mmol/L (ref 135–145)

## 2021-03-21 LAB — PHOSPHORUS: Phosphorus: 3.1 mg/dL (ref 2.5–4.6)

## 2021-03-21 MED ORDER — HYDROXYZINE HCL 25 MG PO TABS
25.0000 mg | ORAL_TABLET | Freq: Three times a day (TID) | ORAL | Status: DC
  Administered 2021-03-21 – 2021-03-22 (×4): 25 mg via ORAL
  Filled 2021-03-21 (×6): qty 1

## 2021-03-21 MED ORDER — BOOST / RESOURCE BREEZE PO LIQD CUSTOM
1.0000 | Freq: Three times a day (TID) | ORAL | Status: DC
  Administered 2021-03-21 – 2021-03-22 (×2): 1 via ORAL

## 2021-03-21 MED ORDER — POTASSIUM CHLORIDE CRYS ER 20 MEQ PO TBCR
40.0000 meq | EXTENDED_RELEASE_TABLET | Freq: Once | ORAL | Status: AC
  Administered 2021-03-21: 40 meq via ORAL
  Filled 2021-03-21: qty 2

## 2021-03-21 NOTE — Progress Notes (Signed)
Initial Nutrition Assessment  DOCUMENTATION CODES:  Non-severe (moderate) malnutrition in context of social or environmental circumstances  INTERVENTION:  Continue current diet as ordered, encourage PO intake. Boost Breeze po TID, each supplement provides 250 kcal and 9 grams of protein Continue MVI with minerals, thiamine, and folic acid as ordered  NUTRITION DIAGNOSIS:  Moderate Malnutrition (in the context of social/environmental factors) related to poor appetite as evidenced by mild fat depletion,mild muscle depletion,energy intake < 75% for > 7 days.  GOAL:  Patient will meet greater than or equal to 90% of their needs   MONITOR:  PO intake,Supplement acceptance,Labs,Skin  REASON FOR ASSESSMENT:  Malnutrition Screening Tool    ASSESSMENT:  Pt presented to ED with seizure like activity. Pt reports abdominal pain x 3-4 days, along with nausea, vomiting. He has been unable to eat or drink much. He has also recently relapsed on EtOH. Pt passed out earlier in day in the lobby of the hotel he is staying at. Seizure-like activity reported by staff at hotel and that patient has been drinking fairly heavily during stay. PMH relevant for EtOH abuse (hx of withdrawal seizures), HTN.   Pt resting in bed at the time of visit, covered in blankets. Pt reports a fair appetite. States that intake is at baseline. Has not been eating well for a few weeks though. Usually eats 2x/d at home (skips breakfast). Several snack items noted at bedside. Pt states usual weight at ~140 lb. Pt reports that he has had nutrition supplements in past but does not tolerate dairy well, agreeable to boost breeze. Denies GI distress today  Average Meal Intake: . 5/27-5/30: 45% intake x 2 recorded meals  Nutritionally Relevant Scheduled Meds: . alum & mag hydroxide-simeth  30 mL Oral Once  . doxycycline  100 mg Oral Q12H  . folic acid  1 mg Oral Daily  . multivitamin with minerals  1 tablet Oral Daily  . thiamine   100 mg Intravenous Daily   PRN Meds: alum & mag hydroxide-simeth, loperamide, metoCLOPramide, ondansetron, senna-docusate  Labs Reviewed:  K 3.3  SBG ranges from 103-114 mg/dL over the last 24 hours  NUTRITION - FOCUSED PHYSICAL EXAM: Flowsheet Row Most Recent Value  Orbital Region Mild depletion  Upper Arm Region Moderate depletion  Thoracic and Lumbar Region Mild depletion  Buccal Region Mild depletion  Temple Region No depletion  Clavicle Bone Region Mild depletion  Clavicle and Acromion Bone Region Mild depletion  Scapular Bone Region No depletion  Dorsal Hand No depletion  Patellar Region Moderate depletion  Anterior Thigh Region Moderate depletion  Posterior Calf Region Moderate depletion  Edema (RD Assessment) None  Hair Reviewed  Eyes Reviewed  Mouth Reviewed  Skin Reviewed  Nails Reviewed     Diet Order:   Diet Order            Diet regular Room service appropriate? Yes; Fluid consistency: Thin  Diet effective now                 EDUCATION NEEDS:  No education needs have been identified at this time  Skin:  Skin Assessment: Reviewed RN Assessment  Last BM:  5/28 per RN documentation  Height:  Ht Readings from Last 1 Encounters:  03/19/21 5\' 7"  (1.702 m)    Weight:  Wt Readings from Last 1 Encounters:  03/19/21 67.6 kg    Ideal Body Weight:  67.3 kg  BMI:  Body mass index is 23.34 kg/m.  Estimated Nutritional Needs:  Kcal:  1900-2100 kcal/d  Protein:  95-110 g/d  Fluid:  >2L/d   Greig Castilla, RD, LDN Clinical Dietitian Pager on Amion

## 2021-03-21 NOTE — Progress Notes (Signed)
PROGRESS NOTE    Isaac Jackson  KGM:[id number] DOB: 1971-03-03 DOA: 03/18/2021 PCP: Pcp, No  Outpatient Specialists: none    Brief Narrative:   Isaac Jackson is a 49 y.o. male with medical history significant for alcohol use disorder with prior history of withdrawal and alcohol associated pancreatitis, hypertension, history of renal stones, depression/anxiety, tobacco use who presents to the ED for evaluation of epigastric pain, nausea, vomiting.  Patient reports chronic alcohol use, 3-5 liquor drinks per day.  He reported higher daily intake to the emergency provider.  He says over the last 3 days he has been having frequent nausea with vomiting.  He has had nonradiating epigastric pain.  He has had some chills and diaphoresis.  He says his last alcoholic drink was yesterday 5/26.  He has been having tremors today.  He reports decreased urine output than expected.  Per ED triage notes, [facility] due to staff reporting "seizure-like activity."  He reportedly fell today as well without any significant injury.  EMS noted that patient kept "flopping" out of the wheelchair.  This was reported as seizure-like activity but patient did not have any loss of consciousness or postictal phase.  Patient also reports erythema and pain at his right lateral ankle area.  He says the area was pruritic and he has been scratching at it.  He saw some discharge from the area earlier.  Patient reports ongoing tobacco use, less than half pack per day.  He denies any cocaine or IV drug use.  He says the only medications he has been taking her losartan 100 mg, HCTZ 12.5 mg, and Flomax but has not taken any of these in the last week.   Assessment & Plan:   Principal Problem:   Alcohol use with intoxication with complication (HCC) Active Problems:   HTN (hypertension)   BPH (benign prostatic hyperplasia)   Alcohol withdrawal seizure with delirium  (HCC)   Alcohol use disorder, severe, dependence (HCC)   Alcohol withdrawal (HCC)   # Alcohol dependence # Alcohol withdrawal # History complicated withdrawal Last drink AM 4/27, etoh level elevated on arrival. Reports hx etoh withdrawal seizure. Reports current symptoms typical for his withdrawal. Nausea/vomiting improving. Remains tremulous. Hcv/hbv neg. - librium taper, ciwa ativan protocol - hydroxyzine standing - tolerating diet - seizure precautions - thiamine/folate - daily mg, phos  # Hypertension: Has been off home losartan and HCTZ for 1 week. bp wnl - monitor  # Hypokalemia: Hyperkalemic w/ ivf with k, stopped that, now k 3.3, mg wnl - kcl 40 - trend  # Right lateral ankle cellulitis No signs abscess. Stable - cont doxy started 5/27  # Hepatic steatosis: Seen on CT imaging, likely related to ongoing alcohol use.  # Depression/anxiety: Not currently on medical therapy.  # BPH: Continue Flomax.  # Tobacco use: Nicotine patch ordered.   DVT prophylaxis: lovenox Code Status: full Family Communication: none @ bedside. Mother is primary contact. Pt requests we not contact her  Level of care: Med-Surg Status is: inpt  The patient will require care spanning > 2 midnights and should be moved to inpatient because: Inpatient level of care appropriate due to severity of illness  Dispo: The patient is from: Home              Anticipated d/c is to: Home              Patient currently is not medically stable  to d/c.   Difficult to place patient No        Consultants:  none  Procedures: none  Antimicrobials:  doxycycline   Subjective: This morning tremulous, eating breakfast. Requests visteril standing. Mild nausea.  Objective: Vitals:   03/20/21 1952 03/20/21 2353 03/21/21 0456 03/21/21 0758  BP: (!) 133/91 100/79 99/65 (!) 128/92  Pulse: 89 80 63 72  Resp: 18 19 18 17   Temp: 98.1 F (36.7 C) 98.1 F (36.7 C) 98.5 F (36.9 C) 97.8  F (36.6 C)  TempSrc: Oral Oral  Oral  SpO2: 100% 100% 100% 98%  Weight:      Height:        Intake/Output Summary (Last 24 hours) at 03/21/2021 0954 Last data filed at 03/20/2021 1350 Gross per 24 hour  Intake 60 ml  Output --  Net 60 ml   Filed Weights   03/18/21 1415 03/19/21 2125  Weight: 63.5 kg 67.6 kg    Examination:  General exam: Appears anxious Respiratory system: Clear to auscultation. Respiratory effort normal. Cardiovascular system: S1 & S2 heard, RRR. No JVD, murmurs, rubs, gallops or clicks. No pedal edema. Gastrointestinal system: Abdomen is nondistended, soft and nontender. No organomegaly or masses felt. Normal bowel sounds heard. Central nervous system: Alert and oriented. No focal neurological deficits. tremor Extremities: Symmetric 5 x 5 power. Skin: No rashes, lesions or ulcers Psychiatry: Judgement and insight appear normal.     Data Reviewed: I have personally reviewed following labs and imaging studies  CBC: Recent Labs  Lab 03/18/21 1446 03/19/21 0518  WBC 8.4 6.3  HGB 17.9* 13.2  HCT 50.5 37.2*  MCV 91.0 92.8  PLT 236 160   Basic Metabolic Panel: Recent Labs  Lab 03/18/21 1446 03/18/21 1537 03/19/21 0518 03/20/21 0452 03/21/21 0518  NA 136  --  137 140 139  K 3.4*  --  5.3* 3.6 3.3*  CL 95*  --  105 107 107  CO2 26  --  25 27 24   GLUCOSE 111*  --  121* 103* 114*  BUN 12  --  15 12 19   CREATININE 1.05  --  1.04 0.79 0.89  CALCIUM 9.5  --  8.2* 8.2* 8.4*  MG  --  2.1  --  2.0 2.0  PHOS  --   --   --  2.6 3.1   GFR: Estimated Creatinine Clearance: 93.9 mL/min (by C-G formula based on SCr of 0.89 mg/dL). Liver Function Tests: Recent Labs  Lab 03/18/21 1537  AST 24  ALT 20  ALKPHOS 63  BILITOT 0.9  PROT 8.1  ALBUMIN 4.7   Recent Labs  Lab 03/18/21 1537  LIPASE 30   No results for input(s): AMMONIA in the last 168 hours. Coagulation Profile: No results for input(s): INR, PROTIME in the last 168 hours. Cardiac  Enzymes: No results for input(s): CKTOTAL, CKMB, CKMBINDEX, TROPONINI in the last 168 hours. BNP (last 3 results) No results for input(s): PROBNP in the last 8760 hours. HbA1C: No results for input(s): HGBA1C in the last 72 hours. CBG: No results for input(s): GLUCAP in the last 168 hours. Lipid Profile: No results for input(s): CHOL, HDL, LDLCALC, TRIG, CHOLHDL, LDLDIRECT in the last 72 hours. Thyroid Function Tests: No results for input(s): TSH, T4TOTAL, FREET4, T3FREE, THYROIDAB in the last 72 hours. Anemia Panel: No results for input(s): VITAMINB12, FOLATE, FERRITIN, TIBC, IRON, RETICCTPCT in the last 72 hours. Urine analysis:    Component Value Date/Time   COLORURINE YELLOW (A) 02/21/2021  1345   APPEARANCEUR CLEAR (A) 02/21/2021 1345   LABSPEC 1.005 02/21/2021 1345   PHURINE 7.0 02/21/2021 1345   GLUCOSEU NEGATIVE 02/21/2021 1345   HGBUR MODERATE (A) 02/21/2021 1345   BILIRUBINUR NEGATIVE 02/21/2021 1345   KETONESUR NEGATIVE 02/21/2021 1345   PROTEINUR NEGATIVE 02/21/2021 1345   NITRITE NEGATIVE 02/21/2021 1345   LEUKOCYTESUR NEGATIVE 02/21/2021 1345   Sepsis Labs: @LABRCNTIP (procalcitonin:4,lacticidven:4)  ) Recent Results (from the past 240 hour(s))  Resp Panel by RT-PCR (Flu A&B, Covid) Nasopharyngeal Swab     Status: Abnormal   Collection Time: 03/18/21  8:18 PM   Specimen: Nasopharyngeal Swab; Nasopharyngeal(NP) swabs in vial transport medium  Result Value Ref Range Status   SARS Coronavirus 2 by RT PCR TEST WILL BE CREDITED (A) NEGATIVE Final   Influenza A by PCR TEST WILL BE CREDITED (A) NEGATIVE Final   Influenza B by PCR TEST WILL BE CREDITED (A) NEGATIVE Final    Comment: (NOTE) The Xpert Xpress SARS-CoV-2/FLU/RSV plus assay is intended as an aid in the diagnosis of influenza from Nasopharyngeal swab specimens and should not be used as a sole basis for treatment. Nasal washings and aspirates are unacceptable for Xpert Xpress  SARS-CoV-2/FLU/RSV testing.  Fact Sheet for Patients: BloggerCourse.com  Fact Sheet for Healthcare Providers: SeriousBroker.it  This test is not yet approved or cleared by the [place] FDA and has been authorized for detection and/or diagnosis of SARS-CoV-2 by FDA under an Emergency Use Authorization (EUA). This EUA will remain in effect (meaning this test can be used) for the duration of the COVID-19 declaration under Section 564(b)(1) of the Act, 21 U.S.C. section 360bbb-3(b)(1), unless the authorization is terminated or revoked.  Performed at [facility], [address] [place], [place] [address]   Resp Panel by RT-PCR (Flu A&B, Covid)     Status: None   Collection Time: 03/18/21  9:11 PM  Result Value Ref Range Status   SARS Coronavirus 2 by RT PCR NEGATIVE NEGATIVE Final    Comment: (NOTE) SARS-CoV-2 target nucleic acids are NOT DETECTED.  The SARS-CoV-2 RNA is generally detectable in upper respiratory specimens during the acute phase of infection. The lowest concentration of SARS-CoV-2 viral copies this assay can detect is 138 copies/mL. A negative result does not preclude SARS-Cov-2 infection and should not be used as the sole basis for treatment or other patient management decisions. A negative result may occur with  improper specimen collection/handling, submission of specimen other than nasopharyngeal swab, presence of viral mutation(s) within the areas targeted by this assay, and inadequate number of viral copies(<138 copies/mL). A negative result must be combined with clinical observations, patient history, and epidemiological information. The expected result is Negative.  Fact Sheet for Patients:  BloggerCourse.com  Fact Sheet for Healthcare Providers:  SeriousBroker.it  This test is no t yet approved or cleared by the [place] FDA and   has been authorized for detection and/or diagnosis of SARS-CoV-2 by FDA under an Emergency Use Authorization (EUA). This EUA will remain  in effect (meaning this test can be used) for the duration of the COVID-19 declaration under Section 564(b)(1) of the Act, 21 U.S.C.section 360bbb-3(b)(1), unless the authorization is terminated  or revoked sooner.       Influenza A by PCR NEGATIVE NEGATIVE Final   Influenza B by PCR NEGATIVE NEGATIVE Final    Comment: (NOTE) The Xpert Xpress SARS-CoV-2/FLU/RSV plus assay is intended as an aid in the diagnosis of influenza from Nasopharyngeal swab specimens and should not  be used as a sole basis for treatment. Nasal washings and aspirates are unacceptable for Xpert Xpress SARS-CoV-2/FLU/RSV testing.  Fact Sheet for Patients: BloggerCourse.com  Fact Sheet for Healthcare Providers: SeriousBroker.it  This test is not yet approved or cleared by the [place] FDA and has been authorized for detection and/or diagnosis of SARS-CoV-2 by FDA under an Emergency Use Authorization (EUA). This EUA will remain in effect (meaning this test can be used) for the duration of the COVID-19 declaration under Section 564(b)(1) of the Act, 21 U.S.C. section 360bbb-3(b)(1), unless the authorization is terminated or revoked.  Performed at [facility], [address] [place], [place] [address]          Radiology Studies: No results found.      Scheduled Meds: . alum & mag hydroxide-simeth  30 mL Oral Once   And  . lidocaine  15 mL Oral Once  . chlordiazePOXIDE  25 mg Oral BH-qamhs   Followed by  . [START ON 03/22/2021] chlordiazePOXIDE  25 mg Oral Daily  . doxycycline  100 mg Oral Q12H  . enoxaparin (LOVENOX) injection  40 mg Subcutaneous Q24H  . folic acid  1 mg Oral Daily  . hydrOXYzine  25 mg Oral TID  . multivitamin with minerals  1 tablet Oral Daily  . nicotine  14 mg  Transdermal Daily  . potassium chloride  40 mEq Oral Once  . sodium chloride flush  3 mL Intravenous Q12H  . tamsulosin  0.4 mg Oral Daily  . thiamine  100 mg Oral Daily   Or  . thiamine  100 mg Intravenous Daily   Continuous Infusions:    LOS: 2 days    Time spent: 25 min    Silvano Bilis, MD [facility]   If 7PM-7AM, please contact night-coverage [email or web address] Password [facility] 03/21/2021, 9:54 AM

## 2021-03-21 NOTE — Plan of Care (Signed)
  Problem: Clinical Measurements: Goal: Diagnostic test results will improve Outcome: Progressing   Problem: Education: Goal: Knowledge of General Education information will improve Description: Including pain rating scale, medication(s)/side effects and non-pharmacologic comfort measures Outcome: Progressing   Problem: Health Behavior/Discharge Planning: Goal: Ability to manage health-related needs will improve Outcome: Progressing   Problem: Clinical Measurements: Goal: Ability to maintain clinical measurements within normal limits will improve Outcome: Progressing Goal: Will remain free from infection Outcome: Progressing Goal: Diagnostic test results will improve Outcome: Progressing Goal: Respiratory complications will improve Outcome: Progressing Goal: Cardiovascular complication will be avoided Outcome: Progressing   Problem: Activity: Goal: Risk for activity intolerance will decrease Outcome: Progressing   Problem: Nutrition: Goal: Adequate nutrition will be maintained Outcome: Progressing   Problem: Coping: Goal: Level of anxiety will decrease Outcome: Progressing   Problem: Elimination: Goal: Will not experience complications related to bowel motility Outcome: Progressing Goal: Will not experience complications related to urinary retention Outcome: Progressing   Problem: Pain Managment: Goal: General experience of comfort will improve Outcome: Progressing   Problem: Safety: Goal: Ability to remain free from injury will improve Outcome: Progressing   Problem: Skin Integrity: Goal: Risk for impaired skin integrity will decrease Outcome: Progressing   

## 2021-03-22 DIAGNOSIS — E440 Moderate protein-calorie malnutrition: Secondary | ICD-10-CM | POA: Insufficient documentation

## 2021-03-22 LAB — BASIC METABOLIC PANEL
Anion gap: 6 (ref 5–15)
BUN: 15 mg/dL (ref 6–20)
CO2: 24 mmol/L (ref 22–32)
Calcium: 8.6 mg/dL — ABNORMAL LOW (ref 8.9–10.3)
Chloride: 108 mmol/L (ref 98–111)
Creatinine, Ser: 0.79 mg/dL (ref 0.61–1.24)
GFR, Estimated: >60 mL/min (ref >60)
Glucose, Bld: 108 mg/dL — ABNORMAL HIGH (ref 70–99)
Potassium: 3.8 mmol/L (ref 3.5–5.1)
Sodium: 138 mmol/L (ref 135–145)

## 2021-03-22 LAB — MAGNESIUM: Magnesium: 2 mg/dL (ref 1.7–2.4)

## 2021-03-22 MED ORDER — DOXYCYCLINE HYCLATE 100 MG PO TABS: 100.0000 mg | ORAL_TABLET | Freq: Two times a day (BID) | ORAL | 0 refills | Status: DC

## 2021-03-22 MED ORDER — TAMSULOSIN HCL 0.4 MG PO CAPS
0.4000 mg | ORAL_CAPSULE | Freq: Every day | ORAL | 1 refills | Status: DC
Start: 2021-03-22 — End: 2022-05-20

## 2021-03-22 MED ORDER — HYDROXYZINE HCL 25 MG PO TABS: 25.0000 mg | ORAL_TABLET | Freq: Three times a day (TID) | ORAL | 0 refills | Status: DC | PRN

## 2021-03-22 MED ORDER — CHLORDIAZEPOXIDE HCL 25 MG PO CAPS: 25.0000 mg | ORAL_CAPSULE | Freq: Every day | ORAL | 0 refills | Status: DC

## 2021-03-22 MED ORDER — TAMSULOSIN HCL 0.4 MG PO CAPS: 0.4000 mg | ORAL_CAPSULE | Freq: Every day | ORAL | 1 refills | Status: DC

## 2021-03-22 MED ORDER — ONDANSETRON HCL 4 MG PO TABS: 4.0000 mg | ORAL_TABLET | Freq: Four times a day (QID) | ORAL | 0 refills | Status: DC | PRN

## 2021-03-22 NOTE — Discharge Summary (Signed)
Isaac Jackson:[id number] DOB: [birth year] DOA: 03/18/2021  PCP: Pcp, No  Admit date: 03/18/2021 Discharge date: 03/22/2021  Time spent: 35 minutes  Recommendations for Outpatient Follow-up:  1. pcp f/u 2. Substance abuse treatment     Discharge Diagnoses:  Principal Problem:   Alcohol use with intoxication with complication (HCC) Active Problems:   HTN (hypertension)   BPH (benign prostatic hyperplasia)   Alcohol withdrawal seizure with delirium (HCC)   Alcohol use disorder, severe, dependence (HCC)   Alcohol withdrawal (HCC)   Malnutrition of moderate degree   Discharge Condition: fair  Diet recommendation: regular  Filed Weights   03/18/21 1415 03/19/21 2125 03/21/21 1755  Weight: 63.5 kg 67.6 kg 65.2 kg    History of present illness:  Isaac Jackson a 49 y.o.malewith medical history significant foralcohol use disorder with prior history of withdrawal and alcohol associated pancreatitis, hypertension, history of renal stones, depression/anxiety, tobacco use who presents to the ED for evaluation of epigastric pain, nausea, vomiting.  Patient reports chronic alcohol use, 3-5 liquor drinks per day. He reported higher daily intake to the emergency provider. He says over the last 3 days he has been having frequent nausea with vomiting. He has had nonradiating epigastric pain. He has had some chills and diaphoresis. He says his last alcoholic drink was yesterday 5/26. He has been having tremors today. He reports decreased urine output than expected.  Per ED triage notes, [facility] due to staff reporting "seizure-like activity." He reportedly fell today as well without any significant injury. EMS noted that patient kept "flopping" out of the wheelchair. This was reported as seizure-like activity but patient did not have any loss of consciousness or postictal phase.  Patient also reports erythema and  pain at his right lateral ankle area. He says the area was pruritic and he has been scratching at it. He saw some discharge from the area earlier.  Patient reports ongoing tobacco use, less than half pack per day. He denies any cocaine or IV drug use. He says the only medications he has been taking her losartan 100 mg, HCTZ 12.5 mg, and Flomax but has not taken any of these in the last week.   Hospital Course:  # Alcohol dependence # Alcohol withdrawal # History complicated withdrawal Last drink AM 4/27, etoh level elevated on arrival. Reports hx etoh withdrawal seizure. Reports current symptoms typical for his withdrawal. Nausea/vomiting improved. Requests discharge 5/31. - pt plans to return to home in [place] where he says he will seek treatment - 7 days librium prescribed as well as hydroxyzine - daily multivitamin advised  # Hypertension: Has been off home losartan and HCTZ for some time. Here bp wnl - hold meds pending pcp f/u  # Right lateral ankle cellulitis No signs abscess. Stable - finish 7 day course doxycycline  # Hepatic steatosis: Seen on CT imaging, likely related to ongoing alcohol use.  # Depression/anxiety: Not currently on medical therapy.  # BPH: Continue Flomax.   Procedures:  none   Consultations:  none  Discharge Exam: Vitals:   03/21/21 2338 03/22/21 0735  BP: (!) 130/95 123/66  Pulse: 72 69  Resp:  16  Temp:  100.3 F (37.9 C)  SpO2:  100%   General exam: Appears anxious Respiratory system: Clear to auscultation. Respiratory effort normal. Cardiovascular system: S1 & S2 heard, RRR. No JVD, murmurs, rubs, gallops or clicks. No pedal edema. Gastrointestinal system: Abdomen is nondistended,  soft and nontender. No organomegaly or masses felt. Normal bowel sounds heard. Central nervous system: Alert and oriented. No focal neurological deficits. tremor Extremities: Symmetric 5 x 5 power. Skin: No rashes, lesions or  ulcers Psychiatry: Judgement and insight appear normal.    Discharge Instructions   Discharge Instructions    Diet - low sodium heart healthy   Complete by: As directed    Discharge wound care:   Complete by: As directed    Change daily   Increase activity slowly   Complete by: As directed      Allergies as of 03/22/2021   No Known Allergies     Medication List    STOP taking these medications   albuterol 108 (90 Base) MCG/ACT inhaler Commonly known as: VENTOLIN HFA   folic acid 1 MG tablet Commonly known as: FOLVITE   hydrochlorothiazide 12.5 MG capsule Commonly known as: MICROZIDE   hydrOXYzine 25 MG capsule Commonly known as: VISTARIL   losartan 100 MG tablet Commonly known as: COZAAR   multivitamin with minerals Tabs tablet   ondansetron 4 MG disintegrating tablet Commonly known as: ZOFRAN-ODT   polyethylene glycol 17 g packet Commonly known as: MIRALAX / GLYCOLAX   senna-docusate 8.6-50 MG tablet Commonly known as: Senokot-S   thiamine 100 MG tablet     TAKE these medications   chlordiazePOXIDE 25 MG capsule Commonly known as: LIBRIUM Take 1 capsule (25 mg total) by mouth daily.   doxycycline 100 MG tablet Commonly known as: VIBRA-TABS Take 1 tablet (100 mg total) by mouth every 12 (twelve) hours.   hydrOXYzine 25 MG tablet Commonly known as: ATARAX/VISTARIL Take 1 tablet (25 mg total) by mouth every 8 (eight) hours as needed.   ondansetron 4 MG tablet Commonly known as: ZOFRAN Take 1 tablet (4 mg total) by mouth every 6 (six) hours as needed for nausea or vomiting.   tamsulosin 0.4 MG Caps capsule Commonly known as: FLOMAX Take 1 capsule (0.4 mg total) by mouth daily.            Discharge Care Instructions  (From admission, onward)         Start     Ordered   03/22/21 0000  Discharge wound care:       Comments: Change daily   03/22/21 1137         No Known Allergies  Follow-up Information    [facility].   Specialty: Emergency Medicine Why: As needed Contact information: [address] [id number] [address] [place] [address] [phone]               The results of significant diagnostics from this hospitalization (including imaging, microbiology, ancillary and laboratory) are listed below for reference.    Significant Diagnostic Studies: CT ABDOMEN PELVIS WO CONTRAST  Result Date: 03/18/2021 CLINICAL DATA:  Abdominal pain, seizure, fall EXAM: CT ABDOMEN AND PELVIS WITHOUT CONTRAST TECHNIQUE: Multidetector CT imaging of the abdomen and pelvis was performed following the standard protocol without IV contrast. COMPARISON:  Renal ultrasound 02/22/2021, CT renal colic 02/21/2021 FINDINGS: Lower chest: Lung bases are clear. The no acute traumatic abnormality of the lower chest wall. Evaluation for rib fractures is limited by respiratory motion artifact. Hepatobiliary: No visible focal liver lesion or direct traumatic hepatic injury is identified. No perihepatic hematoma. Diffuse hepatic hypoattenuation compatible with hepatic steatosis. Sparing along the gallbladder fossa. Intermediate attenuation within the gallbladder is nonspecific though can reflect biliary sludge. No pericholecystic fluid or inflammation.  No visible calcified gallstones. No biliary ductal dilatation. Pancreas: Moderate pancreatic atrophy. No pancreatic ductal dilatation or surrounding inflammatory changes. Spleen: Normal in size. No concerning splenic lesions. No evidence of direct splenic injury or perisplenic hematoma. Adrenals/Urinary Tract: Normal adrenal glands without adrenal hemorrhage or suspicious lesion. Kidneys are symmetric in size and normally located. No convincing evidence of direct renal injury or perinephric hemorrhage. No worrisome visible or contour deforming renal lesions. Multiple bilateral nonobstructing renal calculi, largest is an 11 mm calculus in the upper  pole left kidney. No obstructive urolithiasis or hydronephrosis. Urinary bladder is unremarkable. Stomach/Bowel: Postsurgical changes at the diaphragmatic hiatus likely from prior hiatal hernia repair with small recurrent hiatal hernia. No small bowel thickening or dilatation. Fecalized distal small bowel contents and a moderate colonic stool burden. No colonic dilatation or wall thickening. The appendix is surgically absent. No evidence of bowel obstruction. Vascular/Lymphatic: No significant vascular findings are present. No enlarged abdominal or pelvic lymph nodes. Reproductive: The prostate and seminal vesicles are unremarkable. Other: No large body wall or retroperitoneal hematoma. No traumatic abdominal wall dehiscence. Postsurgical changes from prior ventral hernia repair with multiple surgical mesh anchors. No abdominopelvic free air or fluid. Musculoskeletal: No acute traumatic findings of the bony pelvis or imaged lumbar spine. Musculature is normal and symmetric. IMPRESSION: 1. No acute traumatic findings in the abdomen or pelvis. 2. Nonspecific intermediate attenuation within the gallbladder, can reflect biliary sludge. However, no visible evidence of calcified gallstones, features of acute cholecystitis or biliary ductal dilatation. If there is clinical concern, consider right upper quadrant ultrasound. 3. Hepatic steatosis 4. Nonobstructing bilateral nephrolithiasis. No obstructive urolithiasis or hydronephrosis. 5. Postsurgical changes likely reflecting prior hiatal hernia repair with small residual sliding-type hiatal hernia. 6. Moderate colonic stool burden with fecalized distal small bowel contents, correlate for slowed intestinal transit/constipation. Electronically Signed   By: Kreg Shropshire M.D.   On: 03/18/2021 16:41   CT Head Wo Contrast  Result Date: 03/18/2021 CLINICAL DATA:  Seizure, fall EXAM: CT HEAD WITHOUT CONTRAST TECHNIQUE: Contiguous axial images were obtained from the base of the  skull through the vertex without intravenous contrast. COMPARISON:  None. FINDINGS: Brain: No evidence of acute infarction, hemorrhage, hydrocephalus, extra-axial collection, visible mass lesion or mass effect. Vascular: No hyperdense vessel or unexpected calcification. Skull: No calvarial fracture or suspicious osseous lesion. No scalp swelling or hematoma. Sinuses/Orbits: Paranasal sinuses and mastoid air cells are predominantly clear. Included orbital structures are unremarkable. Other: None. IMPRESSION: No acute intracranial abnormality. Electronically Signed   By: Kreg Shropshire M.D.   On: 03/18/2021 16:42   US RENAL  Result Date: 02/22/2021 CLINICAL DATA:  Acute renal failure EXAM: RENAL / URINARY TRACT ULTRASOUND COMPLETE COMPARISON:  CT abdomen 02/21/2021 FINDINGS: Right Kidney: Renal measurements: 10.5 x 5.2 x 4.6 cm = volume: 129 mL. Multiple right renal calculi the largest is 8 mm. Echogenicity within normal limits. No mass or hydronephrosis visualized. Left Kidney: Renal measurements: 9.9 x 5.1 x 4.7 cm = volume: 123 mL. Multiple left renal calculi. Largest stone 12.3 cm. Echogenicity within normal limits. No mass or hydronephrosis visualized. Bladder: Ureteral jets not identified.  No bladder mass. Other: None. IMPRESSION: Normal renal size. Multiple renal calculi bilaterally. No renal obstruction. Ureteral jets not visualized in the bladder. Electronically Signed   By: Marlan Palau M.D.   On: 02/22/2021 20:19   DG Chest Portable 1 View  Result Date: 03/18/2021 CLINICAL DATA:  50 year old male with cough. EXAM: PORTABLE CHEST 1 VIEW COMPARISON:  None. FINDINGS: The heart size and mediastinal contours are within normal limits. Both lungs are clear. The visualized skeletal structures are unremarkable. IMPRESSION: No active disease. Electronically Signed   By: Elgie Collard M.D.   On: 03/18/2021 20:53   CT Renal Stone Study  Result Date: 02/21/2021 CLINICAL DATA:  Left-sided abdominal pain  EXAM: CT ABDOMEN AND PELVIS WITHOUT CONTRAST TECHNIQUE: Multidetector CT imaging of the abdomen and pelvis was performed following the standard protocol without IV contrast. COMPARISON:  11/20/2020 FINDINGS: Lower chest: No acute pleural or parenchymal lung disease. Hepatobiliary: Diffuse hepatic steatosis without focal abnormality. No intrahepatic biliary duct dilation. Gallbladder is unremarkable. Pancreas: Unremarkable. No pancreatic ductal dilatation or surrounding inflammatory changes. Spleen: Normal in size without focal abnormality. Adrenals/Urinary Tract: There are numerous bilateral nonobstructing renal calculi without change in size or number since prior study. Largest calculus on the left measures up to 12 mm in the upper pole, and on the right measures up to 4 mm. No obstructive uropathy within either kidney. The adrenals and bladder are grossly normal. Stomach/Bowel: No bowel obstruction or ileus. Moderate retained stool throughout the colon. No bowel wall thickening or inflammatory change. Small hiatal hernia. Vascular/Lymphatic: No significant vascular findings are present. No enlarged abdominal or pelvic lymph nodes. Reproductive: Prostate is unremarkable. Other: No free fluid or free intraperitoneal gas. No evidence of abdominal wall hernia. Postsurgical changes are seen from bilateral inguinal hernia repairs. Musculoskeletal: No acute or destructive bony lesions. Reconstructed images demonstrate no additional findings. IMPRESSION: 1. Bilateral nonobstructing renal calculi without change in size or number. 2. Hepatic steatosis. 3. Small hiatal hernia. 4. Moderate fecal retention. Electronically Signed   By: Sharlet Salina M.D.   On: 02/21/2021 17:09   US Abdomen Limited RUQ (LIVER/GB)  Result Date: 03/18/2021 CLINICAL DATA:  Right upper quadrant pain EXAM: ULTRASOUND ABDOMEN LIMITED RIGHT UPPER QUADRANT COMPARISON:  CT from earlier in the same day. FINDINGS: Gallbladder: Gallbladder is well  distended with sludge within. No gallstones are identified. No wall thickening or pericholecystic fluid is noted. Common bile duct: Diameter: 3.5 mm. Liver: Increased in echogenicity consistent with fatty infiltration. Portal vein is patent on color Doppler imaging with normal direction of blood flow towards the liver. Other: None. IMPRESSION: Fatty liver similar to that seen on prior CT. Gallbladder well distended with sludge. Electronically Signed   By: Alcide Clever M.D.   On: 03/18/2021 21:29    Microbiology: Recent Results (from the past 240 hour(s))  Resp Panel by RT-PCR (Flu A&B, Covid) Nasopharyngeal Swab     Status: Abnormal   Collection Time: 03/18/21  8:18 PM   Specimen: Nasopharyngeal Swab; Nasopharyngeal(NP) swabs in vial transport medium  Result Value Ref Range Status   SARS Coronavirus 2 by RT PCR TEST WILL BE CREDITED (A) NEGATIVE Final   Influenza A by PCR TEST WILL BE CREDITED (A) NEGATIVE Final   Influenza B by PCR TEST WILL BE CREDITED (A) NEGATIVE Final    Comment: (NOTE) The Xpert Xpress SARS-CoV-2/FLU/RSV plus assay is intended as an aid in the diagnosis of influenza from Nasopharyngeal swab specimens and should not be used as a sole basis for treatment. Nasal washings and aspirates are unacceptable for Xpert Xpress SARS-CoV-2/FLU/RSV testing.  Fact Sheet for Patients: BloggerCourse.com  Fact Sheet for Healthcare Providers: SeriousBroker.it  This test is not yet approved or cleared by the [place] FDA and has been authorized for detection and/or diagnosis of SARS-CoV-2 by FDA under an Emergency Use Authorization (EUA). This EUA will  remain in effect (meaning this test can be used) for the duration of the COVID-19 declaration under Section 564(b)(1) of the Act, 21 U.S.C. section 360bbb-3(b)(1), unless the authorization is terminated or revoked.  Performed at [facility], [address]  [place], [place] [address]   Resp Panel by RT-PCR (Flu A&B, Covid)     Status: None   Collection Time: 03/18/21  9:11 PM  Result Value Ref Range Status   SARS Coronavirus 2 by RT PCR NEGATIVE NEGATIVE Final    Comment: (NOTE) SARS-CoV-2 target nucleic acids are NOT DETECTED.  The SARS-CoV-2 RNA is generally detectable in upper respiratory specimens during the acute phase of infection. The lowest concentration of SARS-CoV-2 viral copies this assay can detect is 138 copies/mL. A negative result does not preclude SARS-Cov-2 infection and should not be used as the sole basis for treatment or other patient management decisions. A negative result may occur with  improper specimen collection/handling, submission of specimen other than nasopharyngeal swab, presence of viral mutation(s) within the areas targeted by this assay, and inadequate number of viral copies(<138 copies/mL). A negative result must be combined with clinical observations, patient history, and epidemiological information. The expected result is Negative.  Fact Sheet for Patients:  BloggerCourse.com  Fact Sheet for Healthcare Providers:  SeriousBroker.it  This test is no t yet approved or cleared by the [place] FDA and  has been authorized for detection and/or diagnosis of SARS-CoV-2 by FDA under an Emergency Use Authorization (EUA). This EUA will remain  in effect (meaning this test can be used) for the duration of the COVID-19 declaration under Section 564(b)(1) of the Act, 21 U.S.C.section 360bbb-3(b)(1), unless the authorization is terminated  or revoked sooner.       Influenza A by PCR NEGATIVE NEGATIVE Final   Influenza B by PCR NEGATIVE NEGATIVE Final    Comment: (NOTE) The Xpert Xpress SARS-CoV-2/FLU/RSV plus assay is intended as an aid in the diagnosis of influenza from Nasopharyngeal swab specimens and should not be used as a sole basis for treatment.  Nasal washings and aspirates are unacceptable for Xpert Xpress SARS-CoV-2/FLU/RSV testing.  Fact Sheet for Patients: BloggerCourse.com  Fact Sheet for Healthcare Providers: SeriousBroker.it  This test is not yet approved or cleared by the [place] FDA and has been authorized for detection and/or diagnosis of SARS-CoV-2 by FDA under an Emergency Use Authorization (EUA). This EUA will remain in effect (meaning this test can be used) for the duration of the COVID-19 declaration under Section 564(b)(1) of the Act, 21 U.S.C. section 360bbb-3(b)(1), unless the authorization is terminated or revoked.  Performed at [facility], [address] [place], [place] [address]      Labs: Basic Metabolic Panel: Recent Labs  Lab 03/18/21 1446 03/18/21 1537 03/19/21 0518 03/20/21 0452 03/21/21 0518 03/22/21 0541  NA 136  --  137 140 139 138  K 3.4*  --  5.3* 3.6 3.3* 3.8  CL 95*  --  105 107 107 108  CO2 26  --  25 27 24 24   GLUCOSE 111*  --  121* 103* 114* 108*  BUN 12  --  15 12 19 15   CREATININE 1.05  --  1.04 0.79 0.89 0.79  CALCIUM 9.5  --  8.2* 8.2* 8.4* 8.6*  MG  --  2.1  --  2.0 2.0 2.0  PHOS  --   --   --  2.6 3.1  --    Liver Function Tests: Recent Labs  Lab 03/18/21 1537  AST 24  ALT 20  ALKPHOS 63  BILITOT 0.9  PROT 8.1  ALBUMIN 4.7   Recent Labs  Lab 03/18/21 1537  LIPASE 30   No results for input(s): AMMONIA in the last 168 hours. CBC: Recent Labs  Lab 03/18/21 1446 03/19/21 0518  WBC 8.4 6.3  HGB 17.9* 13.2  HCT 50.5 37.2*  MCV 91.0 92.8  PLT 236 160   Cardiac Enzymes: No results for input(s): CKTOTAL, CKMB, CKMBINDEX, TROPONINI in the last 168 hours. BNP: BNP (last 3 results) No results for input(s): BNP in the last 8760 hours.  ProBNP (last 3 results) No results for input(s): PROBNP in the last 8760 hours.  CBG: No results for input(s): GLUCAP in the last 168  hours.     Signed:  Silvano Bilis MD.  [facility] 03/22/2021, 11:41 AM

## 2021-03-22 NOTE — TOC Progression Note (Addendum)
Transition of Care [facility]) - Progression Note    Patient Details  Name: Isaac Jackson MRN: [id number] Date of Birth: 1971/09/17  Transition of Care [facility]) CM/SW [place], RN Phone Number: 03/22/2021, 1:02 PM  Clinical Narrative:    Met with the patient in the room to discuss DC plan and needs He stated that he lives at [facility] and they look out for him, he does not want or need [place] services, he does need transportation and signed the rider waiver that was [facility]ed to [facility] for an uber to take him to his hotel where he lives [place] [address], I provided him with drug and alcohol substance abuse resources, called [facility] to arrange transportation thru [place], [place] to [employer], Will be in [place] at [address] I notified the nurse        Expected Discharge Plan and Services           Expected Discharge Date: 03/22/21                                     Social Determinants of Health (SDOH) Interventions    Readmission Risk Interventions No flowsheet data found.

## 2021-03-22 NOTE — Discharge Instructions (Signed)
Alcohol Abuse and Dependence Information, Adult Alcohol is a widely available drug. People drink alcohol in different amounts. People who drink alcohol very often and in large amounts often have problems during and after drinking. They may develop what is called an alcohol use disorder. There are two main types of alcohol use disorders:  Alcohol abuse. This is when you use alcohol too much or too often. You may use alcohol to make yourself feel happy or to reduce stress. You may have a hard time setting a limit on the amount you drink.  Alcohol dependence. This is when you use alcohol consistently for a period of time, and your body changes as a result. This can make it hard to stop drinking because you may start to feel sick or feel different when you do not use alcohol. These symptoms are known as withdrawal. How can alcohol abuse and dependence affect me? Alcohol abuse and dependence can have a negative effect on your life. Drinking too much can lead to addiction. You may feel like you need alcohol to function normally. You may drink alcohol before work in the morning, during the day, or as soon as you get home from work in the evening. These actions can result in:  Poor work performance.  Job loss.  Financial problems.  Car crashes or criminal charges from driving after drinking alcohol.  Problems in your relationships with friends and family.  Losing the trust and respect of coworkers, friends, and family. Drinking heavily over a [place] period of time can permanently damage your body and brain, and can cause lifelong health issues, such as:  Damage to your liver or pancreas.  Heart problems, high blood pressure, or stroke.  Certain cancers.  Decreased ability to fight infections.  Brain or nerve damage.  Depression.  Early (premature) death. If you are careless or you crave alcohol, it is easy to drink more than your body can handle (overdose). Alcohol overdose is a serious  situation that requires hospitalization. It may lead to permanent injuries or death. What can increase my risk?  Having a family history of alcohol abuse.  Having depression or other mental health conditions.  Beginning to drink at an early age.  Binge drinking often.  Experiencing trauma, stress, and an unstable home life during childhood.  Spending time with people who drink often. What actions can I take to prevent or manage alcohol abuse and dependence?  Do not drink alcohol if: ? Your health care provider tells you not to drink. ? You are pregnant, may be pregnant, or are planning to become pregnant.  If you drink alcohol: ? Limit how much you use to:  0-1 drink a day for women.  0-2 drinks a day for men. ? Be aware of how much alcohol is in your drink. In the [place] one drink equals one 12 oz bottle of beer (355 mL), one 5 oz glass of wine (148 mL), or one 1 oz glass of hard liquor (44 mL).  Stop drinking if you have been drinking too much. This can be very hard to do if you are used to abusing alcohol. If you begin to have withdrawal symptoms, talk with your health care provider or a person that you trust. These symptoms may include anxiety, shaky hands, headache, nausea, sweating, or not being able to sleep.  Choose to drink nonalcoholic beverages in social gatherings and places where there may be alcohol. Activity  Spend more time on activities that you enjoy that do   not involve alcohol, like hobbies or exercise.  Find healthy ways to cope with stress, such as exercise, meditation, or spending time with people you care about. General information  Talk to your family, coworkers, and friends about supporting you in your efforts to stop drinking. If they drink, ask them not to drink around you. Spend more time with people who do not drink alcohol.  If you think that you have an alcohol dependency problem: ? Tell friends or family about your concerns. ? Talk with your  health care provider or another health professional about where to get help. ? Work with a therapist and a chemical dependency counselor. ? Consider joining a support group for people who struggle with alcohol abuse and dependence. Where to find support  Your health care provider.  SMART Recovery: www.smartrecovery.org Therapy and support groups  Local treatment centers or chemical dependency counselors.  Local AA groups in your community: www.aa.org   Where to find more information  Centers for Disease Control and Prevention: www.cdc.gov  National Institute on Alcohol Abuse and Alcoholism: www.niaaa.nih.gov  Alcoholics Anonymous (AA): www.aa.org Contact a health care provider if:  You drank more or for longer than you intended on more than one occasion.  You tried to stop drinking or to cut back on how much you drink, but you were not able to.  You often drink to the point of vomiting or passing out.  You want to drink so badly that you cannot think about anything else.  You have problems in your life due to drinking, but you continue to drink.  You keep drinking even though you feel anxious, depressed, or have experienced memory loss.  You have stopped doing the things you used to enjoy in order to drink.  You have to drink more than you used to in order to get the effect you want.  You experience anxiety, sweating, nausea, shakiness, and trouble sleeping when you try to stop drinking. Get help right away if:  You have thoughts about hurting yourself or others.  You have serious withdrawal symptoms, including: ? Confusion. ? Racing heart. ? High blood pressure. ? Fever. If you ever feel like you may hurt yourself or others, or have thoughts about taking your own life, get help right away. You can go to your nearest emergency department or call:  Your local emergency services (911 in the [place]  A suicide crisis helpline, such as the National Suicide Prevention  Lifeline at 1-800-273-8255. This is open 24 hours a day. Summary  Alcohol abuse and dependence can have a negative effect on your life. Drinking too much or too often can lead to addiction.  If you drink alcohol, limit how much you use.  If you are having trouble keeping your drinking under control, find ways to change your behavior. Hobbies, calming activities, exercise, or support groups can help.  If you feel you need help with changing your drinking habits, talk with your health care provider, a good friend, or a therapist, or go to an AA group. This information is not intended to replace advice given to you by your health care provider. Make sure you discuss any questions you have with your health care provider. Document Revised: 01/28/2019 Document Reviewed: 12/17/2018 Elsevier Patient Education  2021 Elsevier Inc.  

## 2022-05-20 ENCOUNTER — Encounter: Payer: Self-pay | Admitting: Emergency Medicine

## 2022-05-20 ENCOUNTER — Ambulatory Visit
Admission: EM | Admit: 2022-05-20 | Discharge: 2022-05-20 | Disposition: A | Discharge status: Home / Self Care | Payer: 59

## 2022-05-20 DIAGNOSIS — I10 Essential (primary) hypertension: Secondary | ICD-10-CM

## 2022-05-20 DIAGNOSIS — Z760 Encounter for issue of repeat prescription: Secondary | ICD-10-CM | POA: Diagnosis not present

## 2022-05-20 MED ORDER — LOSARTAN POTASSIUM 100 MG PO TABS: 100.0000 mg | ORAL_TABLET | Freq: Every day | ORAL | 0 refills | Status: DC

## 2022-05-20 MED ORDER — TAMSULOSIN HCL 0.4 MG PO CAPS: 0.4000 mg | ORAL_CAPSULE | Freq: Every day | ORAL | 0 refills | Status: DC

## 2022-05-20 NOTE — ED Triage Notes (Signed)
Pt presents for refill on losartan 100mg  and tamsulosin 0.4mg . 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 He has to see his PCP before he can get a refill. He has an appt scheduled for 6  weeks.

## 2022-05-20 NOTE — Discharge Instructions (Addendum)
Take the losartan and tamsulosin as directed.    Your blood pressure is elevated today at 147/100; repeat 136/96.  Please have this rechecked by your primary care provider in 2-4 weeks.

## 2022-05-20 NOTE — ED Provider Notes (Signed)
[facility]    CSN: [id number] Arrival date & time: 05/20/22  1239      History   Chief Complaint Chief Complaint  Patient presents with   Medication Refill    HPI Isaac Jackson is a 51 y.o. male.  Patient presents with request for refill of losartan and tamsulosin.  He is out of these medications; last taken on 05/16/2022.  His PCP is in [place] where he lives; he is here in [place] working; he has an appointment scheduled with his PCP in early September.  He denies numbness, weakness, chest pain, shortness of breath, abdominal pain, difficulty with urination, or other symptoms.  His medical history includes hypertension and BPH.  The history is provided by the patient and medical records.    Past Medical History:  Diagnosis Date   Asthma    History of alcohol abuse    patient reports that he is an alcoholic and goes to AA   Hypertension    Kidney stone    Renal disorder     Patient Active Problem List   Diagnosis Date Noted   Malnutrition of moderate degree 03/22/2021   Alcohol use disorder, severe, dependence (HCC) 03/19/2021   Alcohol withdrawal (HCC) 03/19/2021   Alcohol use with intoxication with complication (HCC) 03/18/2021   Bilateral nephrolithiasis 02/22/2021   Acute renal failure (ARF) (HCC) 02/22/2021   Chest pain 02/22/2021   Hyponatremia 02/22/2021   Elevated bilirubin 02/22/2021   HTN (hypertension) 02/22/2021   BPH (benign prostatic hyperplasia) 02/22/2021   Anxiety 02/22/2021   Kidney stones 02/21/2021   CVA (cerebral vascular accident) (HCC) 12/13/2019   Alcohol withdrawal seizure with delirium (HCC) 12/08/2019   Alcoholic hepatitis 12/08/2019    Past Surgical History:  Procedure Laterality Date   ABDOMINAL SURGERY     APPENDECTOMY     HERNIA REPAIR         Home Medications    Prior to Admission medications   Medication Sig Start Date End Date Taking? Authorizing Provider  losartan (COZAAR) 100 MG tablet Take 1 tablet (100  mg total) by mouth daily. 05/20/22  Yes Mickie Bail, NP  tamsulosin (FLOMAX) 0.4 MG CAPS capsule Take 1 capsule (0.4 mg total) by mouth daily. 05/20/22  Yes Mickie Bail, NP  chlordiazePOXIDE (LIBRIUM) 25 MG capsule Take 1 capsule (25 mg total) by mouth daily. 03/22/21   Wouk, Wilfred Curtis, MD  doxycycline (VIBRA-TABS) 100 MG tablet Take 1 tablet (100 mg total) by mouth every 12 (twelve) hours. 03/22/21   Wouk, Wilfred Curtis, MD  hydrOXYzine (ATARAX/VISTARIL) 25 MG tablet Take 1 tablet (25 mg total) by mouth every 8 (eight) hours as needed. 03/22/21   Wouk, Wilfred Curtis, MD  ondansetron (ZOFRAN) 4 MG tablet Take 1 tablet (4 mg total) by mouth every 6 (six) hours as needed for nausea or vomiting. 03/22/21   Wouk, Wilfred Curtis, MD    Family History Family History  Problem Relation Age of Onset   Diabetes Mother    Prostate cancer Father    Alcohol abuse Father     Social History Social History   Tobacco Use   Smoking status: Former    Types: Cigarettes   Smokeless tobacco: Never  Vaping Use   Vaping Use: Never used  Substance Use Topics   Alcohol use: Yes    Comment: daily   Drug use: Never     Allergies   Patient has no known allergies.   Review of Systems Review of Systems  Constitutional:  Negative for chills and fever.  Respiratory:  Negative for cough and shortness of breath.   Cardiovascular:  Negative for chest pain and palpitations.  Gastrointestinal:  Negative for abdominal pain and vomiting.  Genitourinary:  Negative for difficulty urinating, dysuria and hematuria.  Neurological:  Negative for weakness and numbness.  All other systems reviewed and are negative.    Physical Exam Triage Vital Signs ED Triage Vitals  Enc Vitals Group     BP 05/20/22 1250 (!) 147/100     Pulse Rate 05/20/22 1250 89     Resp 05/20/22 1250 16     Temp 05/20/22 1250 98.4 F (36.9 C)     Temp Source 05/20/22 1250 Oral     SpO2 05/20/22 1250 95 %     Weight --      Height --       Head Circumference --      Peak Flow --      Pain Score 05/20/22 1249 0     Pain Loc --      Pain Edu? --      Excl. in [place]? --    No data found.  Updated Vital Signs BP (!) 136/96 (BP Location: Right Arm)   Pulse 89   Temp 98.4 F (36.9 C) (Oral)   Resp 16   SpO2 95%   Visual Acuity Right Eye Distance:   Left Eye Distance:   Bilateral Distance:    Right Eye Near:   Left Eye Near:    Bilateral Near:     Physical Exam Vitals and nursing note reviewed.  Constitutional:      General: He is not in acute distress.    Appearance: Normal appearance. He is well-developed. He is not ill-appearing.  HENT:     Mouth/Throat:     Mouth: Mucous membranes are moist.  Cardiovascular:     Rate and Rhythm: Normal rate and regular rhythm.     Heart sounds: Normal heart sounds.  Pulmonary:     Effort: Pulmonary effort is normal. No respiratory distress.     Breath sounds: Normal breath sounds.  Abdominal:     General: Bowel sounds are normal.     Palpations: Abdomen is soft.     Tenderness: There is no abdominal tenderness. There is no right CVA tenderness, left CVA tenderness, guarding or rebound.  Musculoskeletal:     Cervical back: Neck supple.  Skin:    General: Skin is warm and dry.  Neurological:     Mental Status: He is alert.  Psychiatric:        Mood and Affect: Mood normal.        Behavior: Behavior normal.      UC Treatments / Results  Labs (all labs ordered are listed, but only abnormal results are displayed) Labs Reviewed - No data to display  EKG   Radiology No results found.  Procedures Procedures (including critical care time)  Medications Ordered in UC Medications - No data to display  Initial Impression / Assessment and Plan / UC Course  I have reviewed the triage vital signs and the nursing notes.  Pertinent labs & imaging results that were available during my care of the patient were reviewed by me and considered in my medical decision  making (see chart for details).  Encounter for medication refill.  Elevated blood pressure reading with hypertension.  30-day refills given on tamsulosin and losartan.  Both of these were last taken 4 days ago.  Discussed with patient that his blood pressure is elevated today and needs to be rechecked by his PCP.  He has an appointment scheduled with his PCP in early September.  Education provided on managing hypertension.  He agrees to plan of care.   Final Clinical Impressions(s) / UC Diagnoses   Final diagnoses:  Encounter for medication refill  Elevated blood pressure reading in office with diagnosis of hypertension     Discharge Instructions      Take the losartan and tamsulosin as directed.    Your blood pressure is elevated today at 147/100; repeat 136/96.  Please have this rechecked by your primary care provider in 2-4 weeks.          ED Prescriptions     Medication Sig Dispense Auth. Provider   tamsulosin (FLOMAX) 0.4 MG CAPS capsule Take 1 capsule (0.4 mg total) by mouth daily. 30 capsule Mickie Bail, NP   losartan (COZAAR) 100 MG tablet Take 1 tablet (100 mg total) by mouth daily. 30 tablet Mickie Bail, NP      PDMP not reviewed this encounter.   Mickie Bail, NP 05/20/22 1325

## 2022-06-22 ENCOUNTER — Telehealth: Payer: Self-pay | Admitting: Emergency Medicine

## 2022-06-22 MED ORDER — TAMSULOSIN HCL 0.4 MG PO CAPS: 0.4000 mg | ORAL_CAPSULE | Freq: Every day | ORAL | 0 refills | Status: DC

## 2022-06-22 MED ORDER — LOSARTAN POTASSIUM 100 MG PO TABS: 100.0000 mg | ORAL_TABLET | Freq: Every day | ORAL | 0 refills | Status: DC

## 2022-06-22 NOTE — Telephone Encounter (Signed)
Patient presents to UC with request for additional 2 to 3 weeks of his blood pressure medicines.  He is scheduled to go back home to [place] at the end of his work schedule here in [place].  He will see his PCP then.  Additional 30 days of losartan and tamsulosin sent to pharmacy.  Blood pressure have been in 120s/80s per patient. Instructed patient to follow up with his PCP in [place] as scheduled.

## 2022-08-10 ENCOUNTER — Emergency Department
Admission: EM | Admit: 2022-08-10 | Discharge: 2022-08-10 | Disposition: A | Discharge status: Home / Self Care | Payer: 59 | Attending: Emergency Medicine | Admitting: Emergency Medicine

## 2022-08-10 ENCOUNTER — Encounter: Payer: Self-pay | Admitting: Emergency Medicine

## 2022-08-10 ENCOUNTER — Other Ambulatory Visit: Payer: Self-pay

## 2022-08-10 DIAGNOSIS — R42 Dizziness and giddiness: Secondary | ICD-10-CM

## 2022-08-10 DIAGNOSIS — I10 Essential (primary) hypertension: Secondary | ICD-10-CM | POA: Diagnosis not present

## 2022-08-10 LAB — URINALYSIS, ROUTINE W REFLEX MICROSCOPIC
Bacteria, UA: NONE SEEN
Bilirubin Urine: NEGATIVE
Glucose, UA: NEGATIVE mg/dL
Ketones, ur: NEGATIVE mg/dL
Leukocytes,Ua: NEGATIVE
Nitrite: NEGATIVE
Protein, ur: NEGATIVE mg/dL
Specific Gravity, Urine: 1.017 (ref 1.005–1.030)
Squamous Epithelial / HPF: NONE SEEN (ref 0–5)
pH: 5 (ref 5.0–8.0)

## 2022-08-10 LAB — BASIC METABOLIC PANEL
Anion gap: 7 (ref 5–15)
BUN: 21 mg/dL — ABNORMAL HIGH (ref 6–20)
CO2: 25 mmol/L (ref 22–32)
Calcium: 8.8 mg/dL — ABNORMAL LOW (ref 8.9–10.3)
Chloride: 103 mmol/L (ref 98–111)
Creatinine, Ser: 0.99 mg/dL (ref 0.61–1.24)
GFR, Estimated: >60 mL/min (ref >60)
Glucose, Bld: 91 mg/dL (ref 70–99)
Potassium: 4.1 mmol/L (ref 3.5–5.1)
Sodium: 135 mmol/L (ref 135–145)

## 2022-08-10 LAB — HEPATIC FUNCTION PANEL
ALT: 16 U/L (ref 0–44)
AST: 17 U/L (ref 15–41)
Albumin: 4.2 g/dL (ref 3.5–5.0)
Alkaline Phosphatase: 55 U/L (ref 38–126)
Bilirubin, Direct: <0.1 mg/dL (ref 0.0–0.2)
Total Bilirubin: 0.5 mg/dL (ref 0.3–1.2)
Total Protein: 7.5 g/dL (ref 6.5–8.1)

## 2022-08-10 LAB — TROPONIN I (HIGH SENSITIVITY): Troponin I (High Sensitivity): 5 ng/L (ref <18)

## 2022-08-10 LAB — CBC
HCT: 49.4 % (ref 39.0–52.0)
Hemoglobin: 16.1 g/dL (ref 13.0–17.0)
MCH: 29.9 pg (ref 26.0–34.0)
MCHC: 32.6 g/dL (ref 30.0–36.0)
MCV: 91.8 fL (ref 80.0–100.0)
Platelets: 184 10*3/uL (ref 150–400)
RBC: 5.38 MIL/uL (ref 4.22–5.81)
RDW: 13.2 % (ref 11.5–15.5)
WBC: 7.8 10*3/uL (ref 4.0–10.5)
nRBC: 0 % (ref 0.0–0.2)

## 2022-08-10 MED ORDER — MECLIZINE HCL 25 MG PO TABS: 25.0000 mg | ORAL_TABLET | Freq: Three times a day (TID) | ORAL | 0 refills | Status: DC | PRN

## 2022-08-10 MED ORDER — LOSARTAN POTASSIUM 100 MG PO TABS: 100.0000 mg | ORAL_TABLET | Freq: Every day | ORAL | 2 refills | Status: AC

## 2022-08-10 MED ORDER — TAMSULOSIN HCL 0.4 MG PO CAPS: 0.4000 mg | ORAL_CAPSULE | Freq: Every day | ORAL | 2 refills | Status: DC

## 2022-08-10 NOTE — ED Triage Notes (Signed)
Pt presents with "blood pressure problems" and "vertigo" for a while. Has not been taking meds, needs to establish primary care for refills of BP meds.  Not dizzy while sitting still.

## 2022-08-10 NOTE — ED Provider Triage Note (Signed)
Emergency Medicine Provider Triage Evaluation Note  Jalynn Betzold , a 51 y.o. male  was evaluated in triage.  Pt complains of elevated blood pressure and vertigo. Out of B/p meds for 2 weeks.    Review of Systems  Positive: Nausea occasionally Negative:  No vomiting , No CP   Physical Exam  BP (!) 167/125 (BP Location: Left Arm)   Pulse 79   Temp 97.9 F (36.6 C) (Oral)   Resp 18   SpO2 95%  Gen:   Awake, no distress   Walking slowly due to vertigo Resp:  Normal effort  MSK:   Moves extremities without difficulty  Other:    Medical Decision Making  Medically screening exam initiated at 1:36 PM.  Appropriate orders placed.  Wilder Kurowski was informed that the remainder of the evaluation will be completed by another provider, this initial triage assessment does not replace that evaluation, and the importance of remaining in the ED until their evaluation is complete.     Johnn Hai, PA-C 08/10/22 1337

## 2022-08-10 NOTE — ED Provider Notes (Signed)
[facility] Provider Note    Event Date/Time   First MD Initiated Contact with Patient 08/10/22 1454     (approximate)   History   Hypertension and Dizziness   HPI  Isaac Jackson is a 51 y.o. male  who comes in with dizzyness, headache,   He reports history of HTN. He reports feeling spinning sensation over the past 6 weeks but the headache for 1 week.  The headache is very minimal to the point where he states that he would not even take anything for the headache.  He reports the spinning sensation is when he tries to sit up or he tries to take some stuff but that he is able to balance himself and then be okay.  He denies any trauma to his neck or neck pain.   He reports off medications from 1.5 weeks due to being from [place]- losartan 100mg .  He reports not eating since yesterday and not staying well hydrated either. He works in the [employer] as well.  Patient reports being sober for 18 months but did report prior history of alcohol use.  Denies fever, chest pain, sob      Physical Exam   Triage Vital Signs: ED Triage Vitals  Enc Vitals Group     BP 08/10/22 1333 (!) 167/125     Pulse Rate 08/10/22 1333 79     Resp 08/10/22 1333 18     Temp 08/10/22 1333 97.9 F (36.6 C)     Temp Source 08/10/22 1333 Oral     SpO2 08/10/22 1333 95 %     Weight --      Height --      Head Circumference --      Peak Flow --      Pain Score 08/10/22 1332 0     Pain Loc --      Pain Edu? --      Excl. in [place]? --     Most recent vital signs: Vitals:   08/10/22 1333  BP: (!) 167/125  Pulse: 79  Resp: 18  Temp: 97.9 F (36.6 C)  SpO2: 95%     General: Awake, no distress.  CV:  Good peripheral perfusion.  Resp:  Normal effort.  Abd:  No distention.  Other:  Current nerves II through XII are intact.  Equal strength in arms and legs.  Finger-nose intact bilaterally.  Heel-to-shin intact bilaterally- tm clear bilateral   ED Results / Procedures / Treatments    Labs (all labs ordered are listed, but only abnormal results are displayed) Labs Reviewed  BASIC METABOLIC PANEL - Abnormal; Notable for the following components:      Result Value   BUN 21 (*)    Calcium 8.8 (*)    All other components within normal limits  URINALYSIS, ROUTINE W REFLEX MICROSCOPIC - Abnormal; Notable for the following components:   Color, Urine YELLOW (*)    APPearance CLEAR (*)    Hgb urine dipstick SMALL (*)    All other components within normal limits  CBC     EKG  My interpretation of EKG:  Normal sinus rate of 75, no st elevation, twi in lead 3 and AVF, normal intervals    RADIOLOGY Pt declined CT   PROCEDURES:  Critical Care performed: No  Procedures   MEDICATIONS ORDERED IN ED: Medications - No data to display   IMPRESSION / MDM / [place] / ED COURSE  I reviewed the  triage vital signs and the nursing notes.   Patient's presentation is most consistent with acute presentation with potential threat to life or bodily function.   Suspect this could be related to dehydration, decreased p.o. intake, vertigo given is worse with position changes.  I considered a centralized process but this is been going on for a few weeks now.  We discussed CT head to rule out any intercranial hemorrhage given the elevated blood pressure with a mild headache even though his neuro exam is reassuring we discussed there is always a small risk for bleeding in the brain.  However patient reports that his headache is very minimal and declines CT imaging.  He states that if things get worse he would return to the ER but he does not want to have that done today.  We discussed the chance of intracranial hemorrhage or stroke but he reports this been going on for weeks and is not much different than a few days ago and does not want to have any CT imaging done.  He understands the risk of this but would like to hold off on CT.  Differential:  dehydration, aki, uti  we will get orthostatics to evaluate for dehydration.  UA no uti small hemoglobin discussed this with patient need to follow this up with urology.  He does report history of kidney stones that could be from that. Bmp normal Cbc nromal    Given this is been going on for a few weeks now patient is declined any additional imaging but he can follow-up with neurology and ENT if symptoms are not getting better.  Was negative for ACS liver test are reassuring.  We discussed the possibility of this fever related to vitamin deficiency given patient's history of EtOH abuse but denies using recently we will follow-up with her primary care doctor for further work-up and testing of this.    We discussed trying the meclizine at home first before using it at work in case it makes him feel sleepy.  The patient is on the cardiac monitor to evaluate for evidence of arrhythmia and/or significant heart rate changes.      FINAL CLINICAL IMPRESSION(S) / ED DIAGNOSES   Final diagnoses:  Uncontrolled hypertension  Vertigo     Rx / DC Orders   ED Discharge Orders     None        Note:  This document was prepared using Dragon voice recognition software and may include unintentional dictation errors.   Concha Se, MD 08/10/22 [phone]

## 2022-08-10 NOTE — Discharge Instructions (Signed)
We discussed work-up including CT imaging of your head we have opted to decline given this has been going on for some weeks now we are restarting you your blood pressure medicine and trying some vertigo medications.  Try to stay well-hydrated with Gatorade without sugar, Pedialyte in case this could be from some dehydration as well.  Return to the ER if you develop worsening symptoms or any other concerns.  I have also given the numbers for ENT and neurology to further work this up if the dizziness is not getting better.

## 2022-08-23 ENCOUNTER — Other Ambulatory Visit: Payer: Self-pay | Admitting: Unknown Physician Specialty

## 2022-08-23 DIAGNOSIS — R42 Dizziness and giddiness: Secondary | ICD-10-CM

## 2022-08-24 ENCOUNTER — Ambulatory Visit
Admission: RE | Admit: 2022-08-24 | Discharge: 2022-08-24 | Disposition: A | Discharge status: Home / Self Care | Payer: 59 | Source: Ambulatory Visit | Attending: Unknown Physician Specialty | Admitting: Unknown Physician Specialty

## 2022-08-24 DIAGNOSIS — R42 Dizziness and giddiness: Secondary | ICD-10-CM | POA: Insufficient documentation

## 2022-08-24 MED ORDER — GADOBUTROL 1 MMOL/ML IV SOLN
6.0000 mL | Freq: Once | INTRAVENOUS | Status: AC | PRN
  Administered 2022-08-24: 6 mL via INTRAVENOUS

## 2022-09-05 ENCOUNTER — Other Ambulatory Visit: Payer: Self-pay | Admitting: Neurology

## 2022-09-05 DIAGNOSIS — R278 Other lack of coordination: Secondary | ICD-10-CM

## 2022-09-10 ENCOUNTER — Ambulatory Visit
Admission: RE | Admit: 2022-09-10 | Discharge: 2022-09-10 | Disposition: A | Discharge status: Home / Self Care | Payer: 59 | Source: Ambulatory Visit | Attending: Neurology | Admitting: Neurology

## 2022-09-10 DIAGNOSIS — R278 Other lack of coordination: Secondary | ICD-10-CM | POA: Diagnosis present

## 2022-09-10 MED ORDER — GADOBUTROL 1 MMOL/ML IV SOLN
6.0000 mL | Freq: Once | INTRAVENOUS | Status: AC | PRN
  Administered 2022-09-10: 6 mL via INTRAVENOUS

## 2022-09-11 ENCOUNTER — Ambulatory Visit
Admission: RE | Admit: 2022-09-11 | Discharge: 2022-09-11 | Disposition: A | Discharge status: Home / Self Care | Payer: 59 | Source: Ambulatory Visit | Attending: Neurology | Admitting: Neurology

## 2022-09-11 DIAGNOSIS — R278 Other lack of coordination: Secondary | ICD-10-CM | POA: Insufficient documentation

## 2022-09-11 MED ORDER — IOHEXOL 300 MG/ML  SOLN
100.0000 mL | Freq: Once | INTRAMUSCULAR | Status: AC | PRN
  Administered 2022-09-11: 100 mL via INTRAVENOUS

## 2022-09-14 IMAGING — CT CT ABD-PELV W/O CM
2 of 5 series · 14 of 46 positions shown, 16 images · non-contrast
Comparison: Renal ultrasound 02/22/2021, CT renal colic 02/21/2021

CLINICAL DATA: Abdominal pain, seizure, fall

EXAM:
CT ABDOMEN AND PELVIS WITHOUT CONTRAST
TECHNIQUE: Multidetector CT imaging of the abdomen and pelvis was performed
following the standard protocol without IV contrast.

[Series 2: routine abd/pel wo · axial · 0.70mm/px · z∈[-797,-377]mm · 11 of 98 slices shown, 13 images]
[im 7/98  soft-tissue]
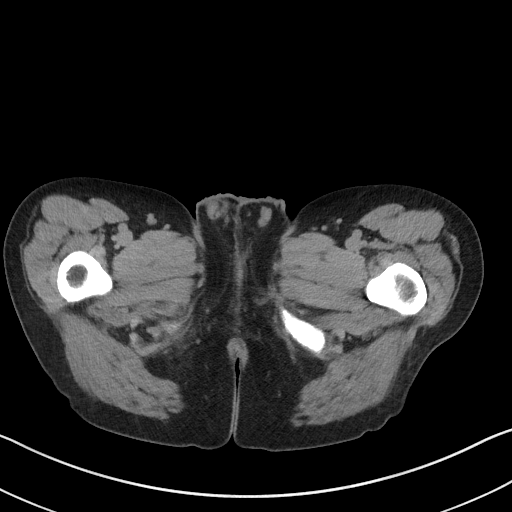
[im 7/98  bone]
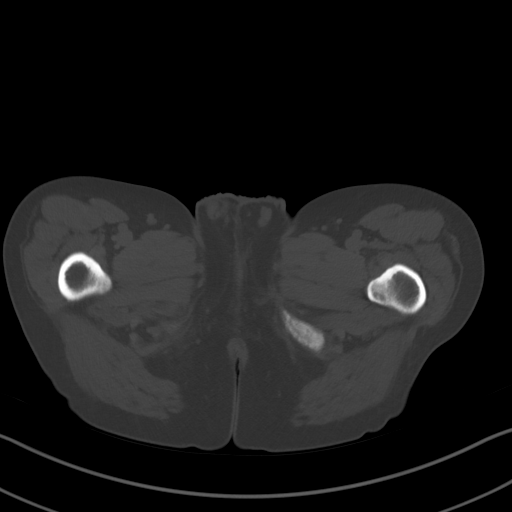
[im 14/98  soft-tissue]
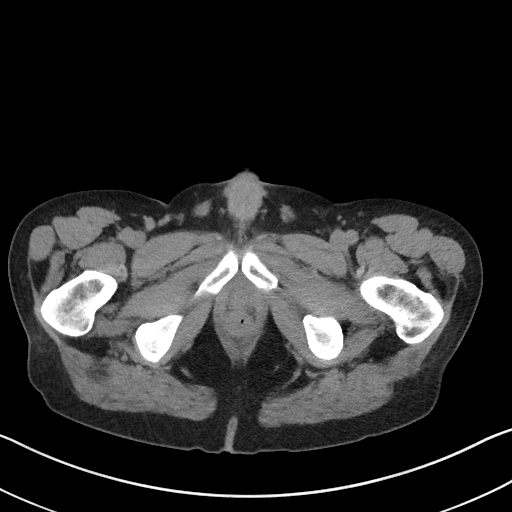
[im 21/98  soft-tissue]
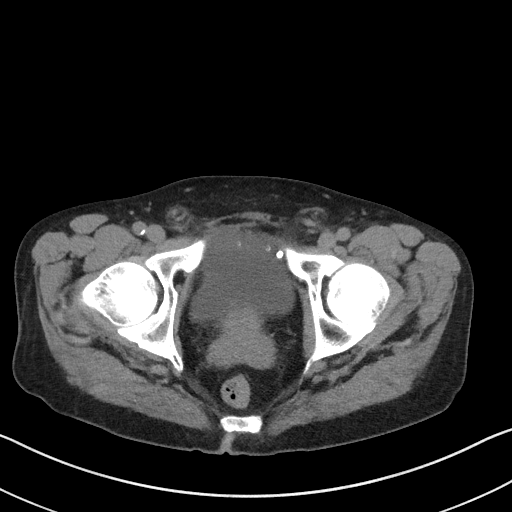
[im 35/98  soft-tissue]
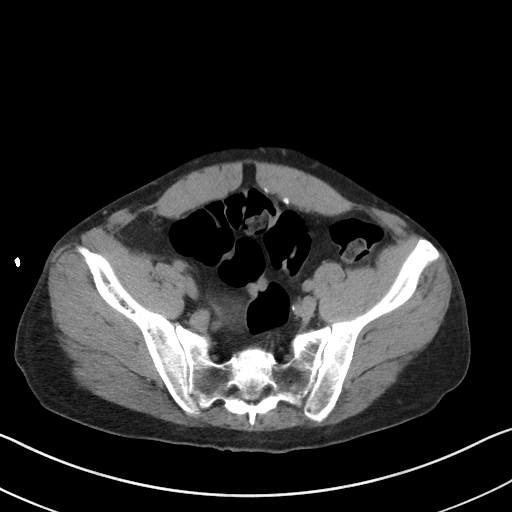
[im 42/98  soft-tissue]
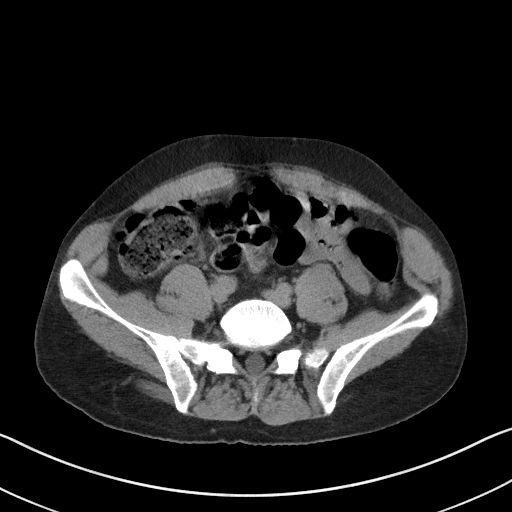
[im 49/98  soft-tissue]
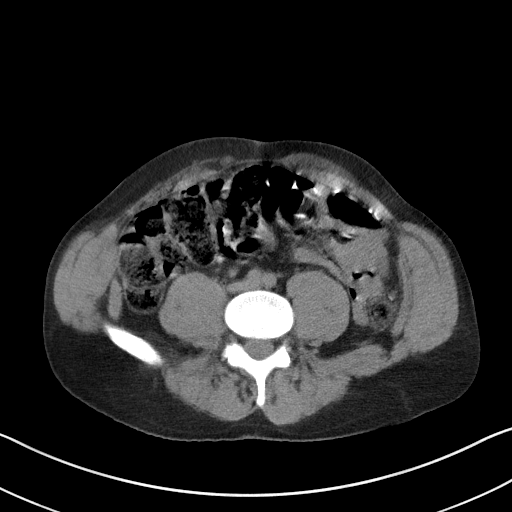
[im 56/98  soft-tissue]
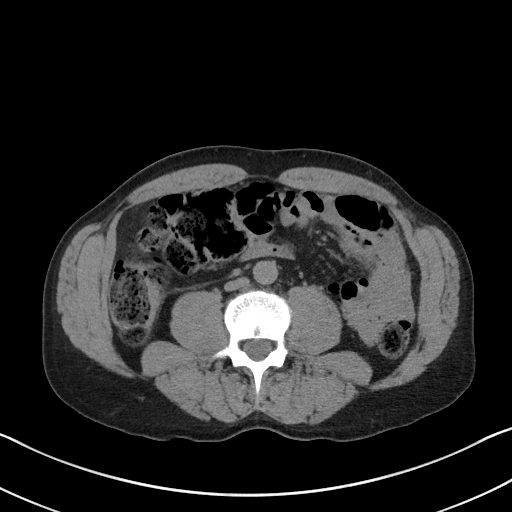
[im 63/98  soft-tissue]
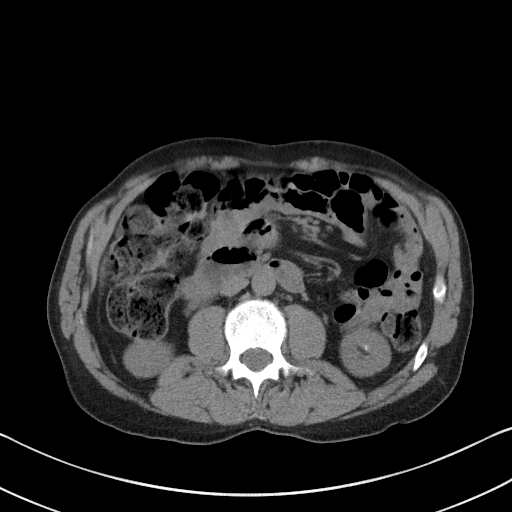
[im 77/98  soft-tissue]
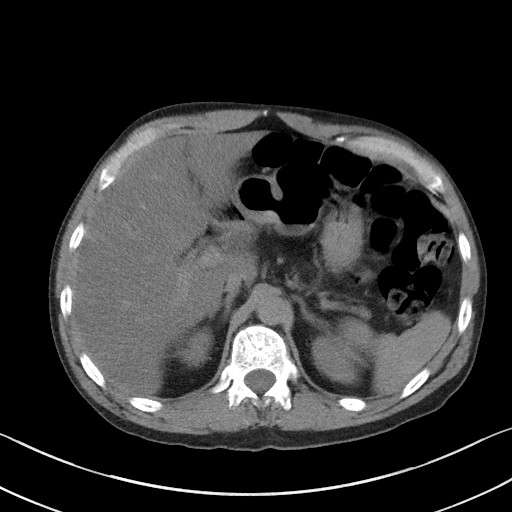
[im 77/98  bone]
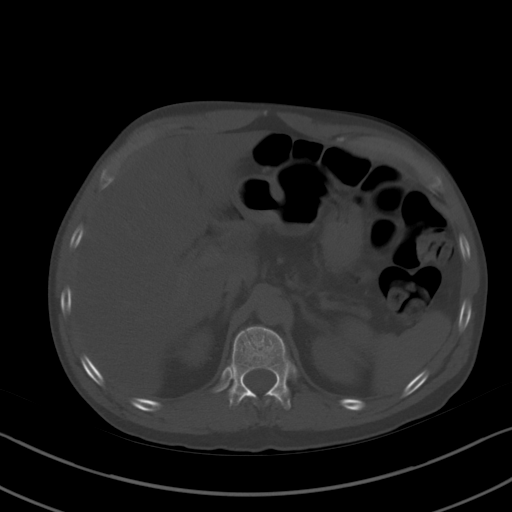
[im 84/98  soft-tissue]
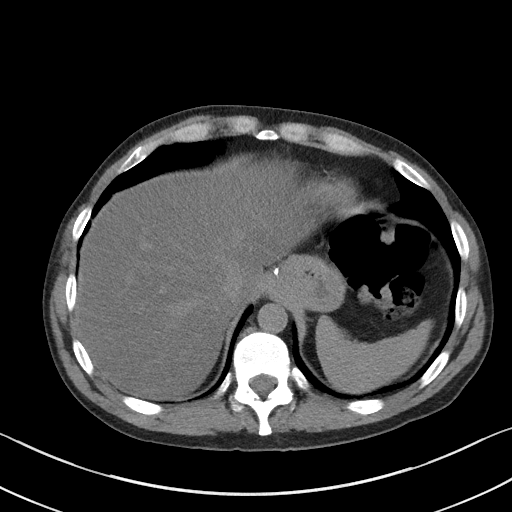
[im 91/98  soft-tissue]
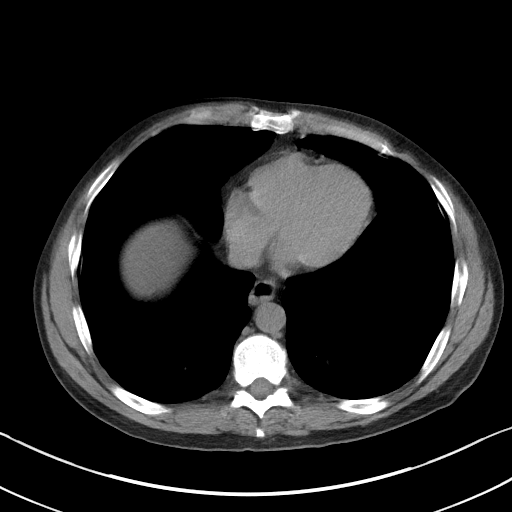

[Series 7: coronal st · coronal · 0.69mm/px · 3 of 78 slices shown]
[im 26/78  soft-tissue]
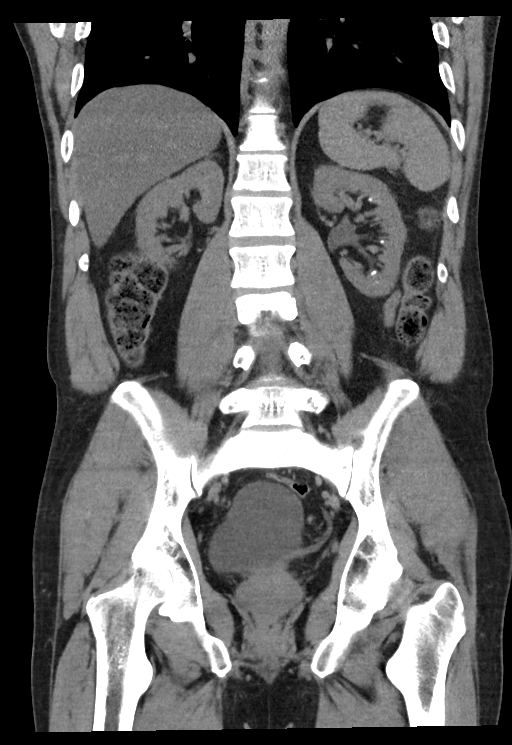
[im 35/78  soft-tissue]
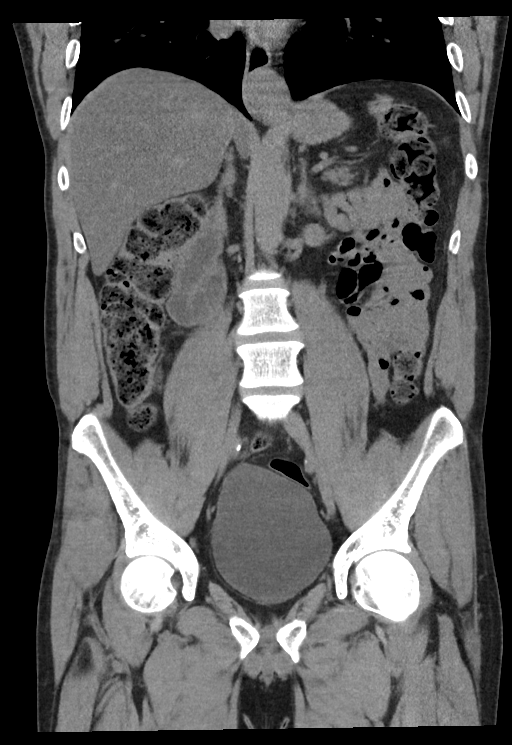
[im 43/78  soft-tissue]
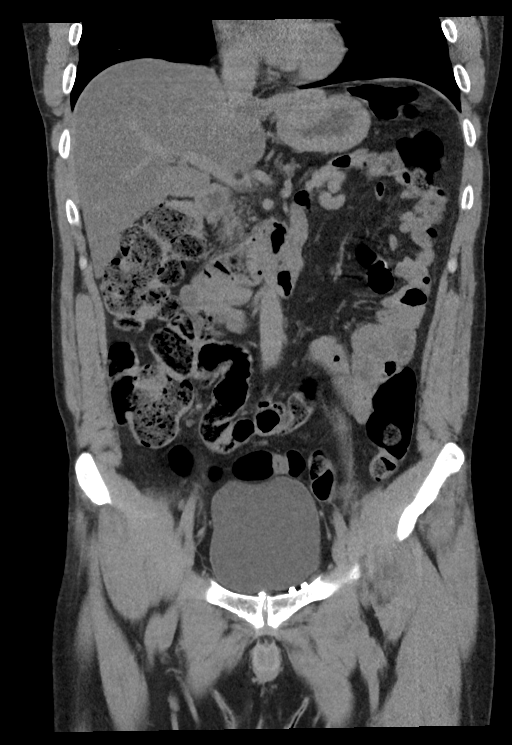

[14 of 46 positions shown; findings below may reference images not displayed]

FINDINGS: Lower chest: Lung bases are clear. The no acute traumatic
abnormality of the lower chest wall. Evaluation for rib fractures is
limited by respiratory motion artifact.

Hepatobiliary: No visible focal liver lesion or direct traumatic
hepatic injury is identified. No perihepatic hematoma. Diffuse
hepatic hypoattenuation compatible with hepatic steatosis. Sparing
along the gallbladder fossa. Intermediate attenuation within the
gallbladder is nonspecific though can reflect biliary sludge. No
pericholecystic fluid or inflammation. No visible calcified
gallstones. No biliary ductal dilatation.

Pancreas: Moderate pancreatic atrophy. No pancreatic ductal
dilatation or surrounding inflammatory changes.

Spleen: Normal in size. No concerning splenic lesions. No evidence
of direct splenic injury or perisplenic hematoma.

Adrenals/Urinary Tract: Normal adrenal glands without adrenal
hemorrhage or suspicious lesion. Kidneys are symmetric in size and
normally located. No convincing evidence of direct renal injury or
perinephric hemorrhage. No worrisome visible or contour deforming
renal lesions. Multiple bilateral nonobstructing renal calculi,
largest is an 11 mm calculus in the upper pole left kidney. No
obstructive urolithiasis or hydronephrosis. Urinary bladder is
unremarkable.

Stomach/Bowel: Postsurgical changes at the diaphragmatic hiatus
likely from prior hiatal hernia repair with small recurrent hiatal
hernia. No small bowel thickening or dilatation. Fecalized distal
small bowel contents and a moderate colonic stool burden. No colonic
dilatation or wall thickening. The appendix is surgically absent. No
evidence of bowel obstruction.

Vascular/Lymphatic: No significant vascular findings are present. No
enlarged abdominal or pelvic lymph nodes.

Reproductive: The prostate and seminal vesicles are unremarkable.

Other: No large body wall or retroperitoneal hematoma. No traumatic
abdominal wall dehiscence. Postsurgical changes from prior ventral
hernia repair with multiple surgical mesh anchors. No abdominopelvic
free air or fluid.

Musculoskeletal: No acute traumatic findings of the bony pelvis or
imaged lumbar spine. Musculature is normal and symmetric.
IMPRESSION: 1. No acute traumatic findings in the abdomen or pelvis.
2. Nonspecific intermediate attenuation within the gallbladder, can
reflect biliary sludge. However, no visible evidence of calcified
gallstones, features of acute cholecystitis or biliary ductal
dilatation. If there is clinical concern, consider right upper
quadrant ultrasound.
3. Hepatic steatosis
4. Nonobstructing bilateral nephrolithiasis. No obstructive
urolithiasis or hydronephrosis.
5. Postsurgical changes likely reflecting prior hiatal hernia repair
with small residual sliding-type hiatal hernia.
6. Moderate colonic stool burden with fecalized distal small bowel
contents, correlate for slowed intestinal transit/constipation.

## 2022-09-14 IMAGING — US US ABDOMEN LIMITED
1 series · 14 of 25 positions shown · non-contrast
Comparison: CT from earlier in the same day.

CLINICAL DATA: Right upper quadrant pain

EXAM:
ULTRASOUND ABDOMEN LIMITED RIGHT UPPER QUADRANT

[Series 1: us abdomen limited ruq (liver/gb) · 14 of 27 slices shown]
[im 1/27]
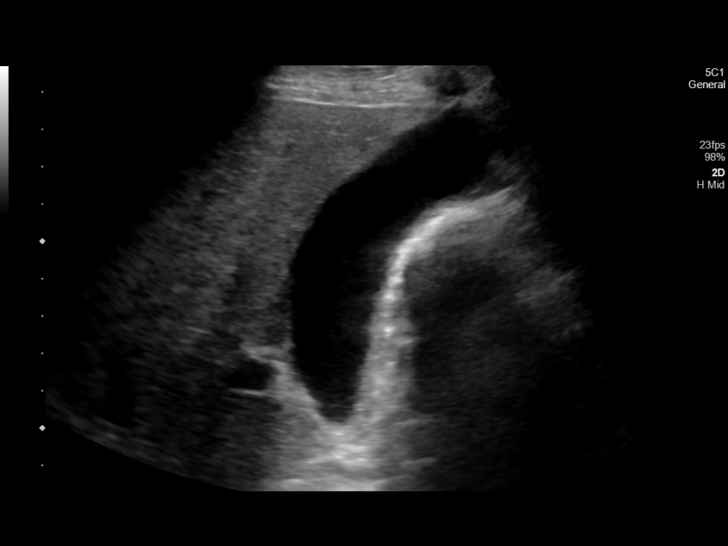
[im 3/27]
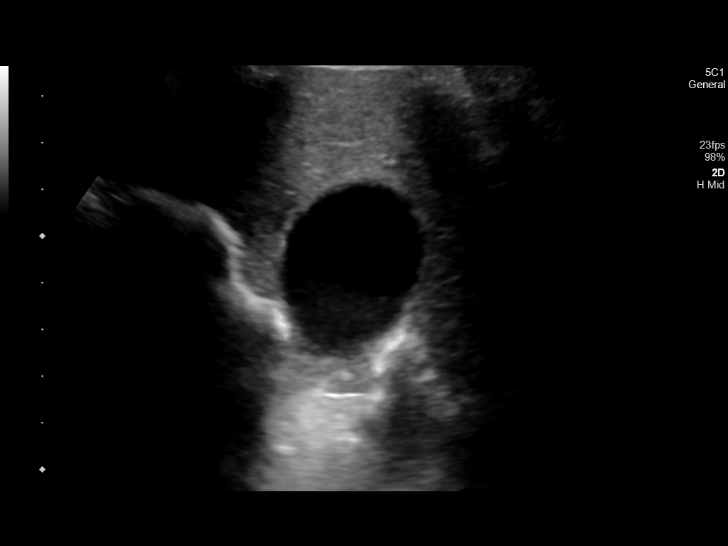
[im 5/27]
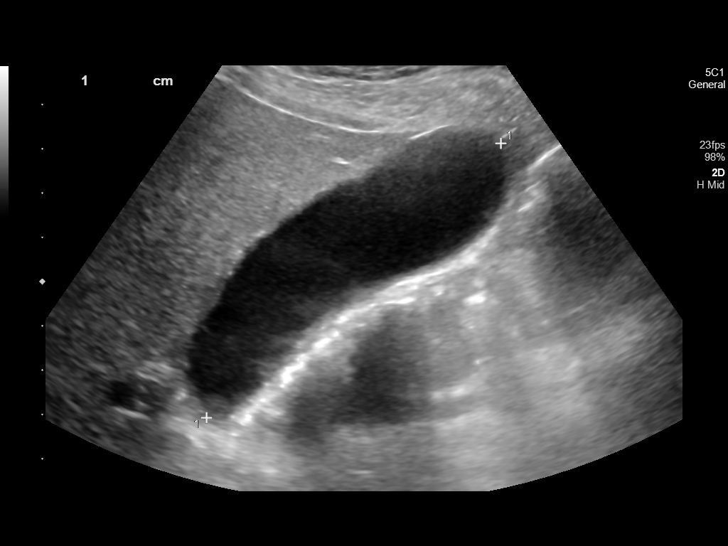
[im 7/27]
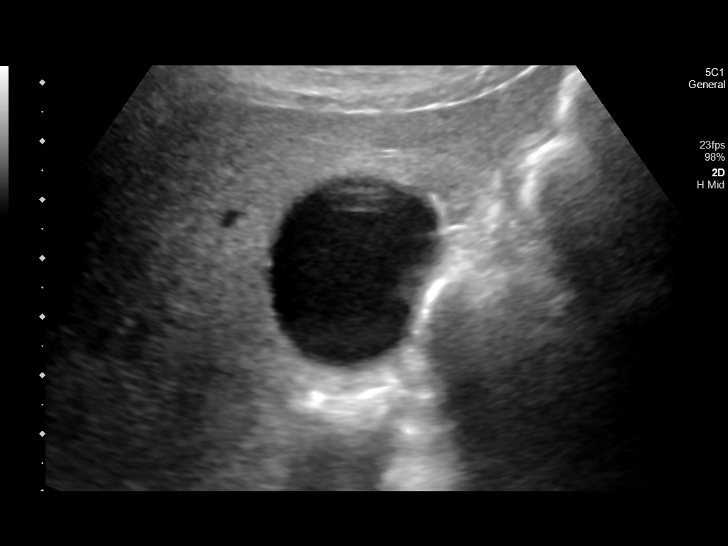
[im 9/27]
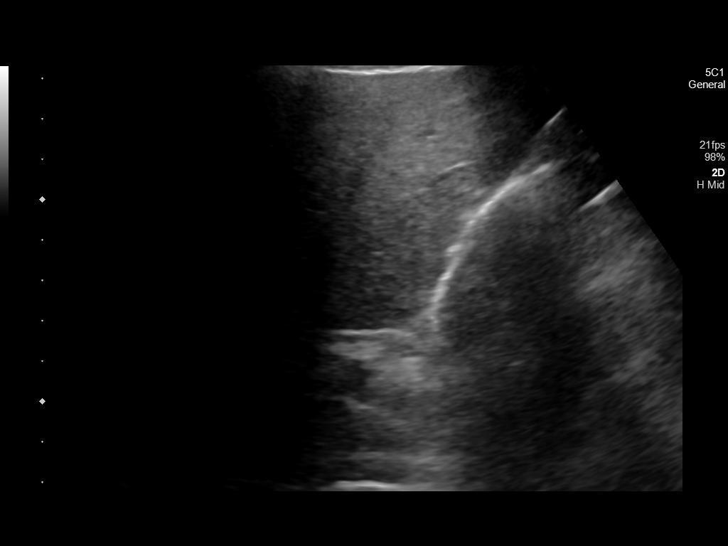
[im 10/27]
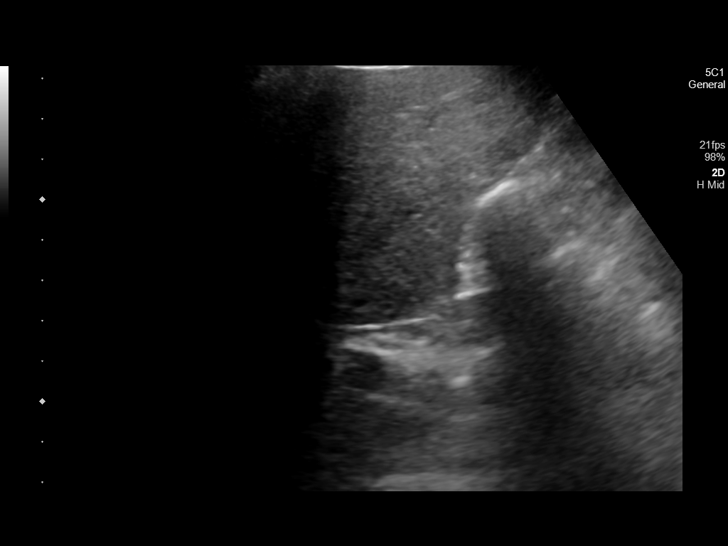
[im 12/27]
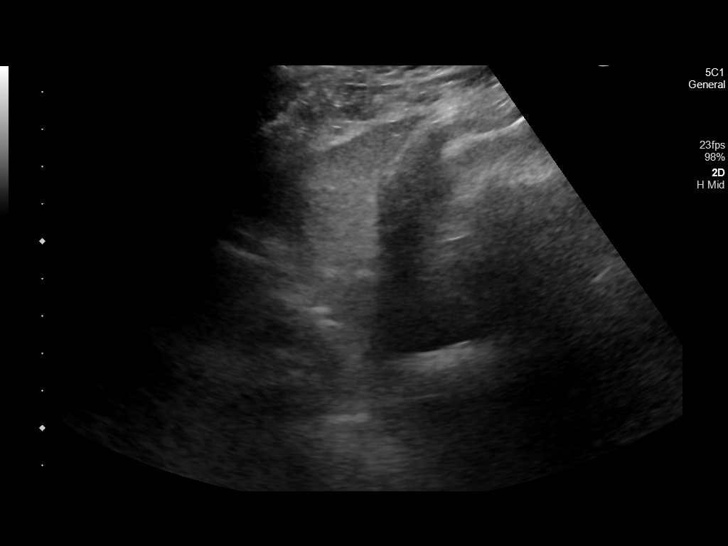
[im 15/27]
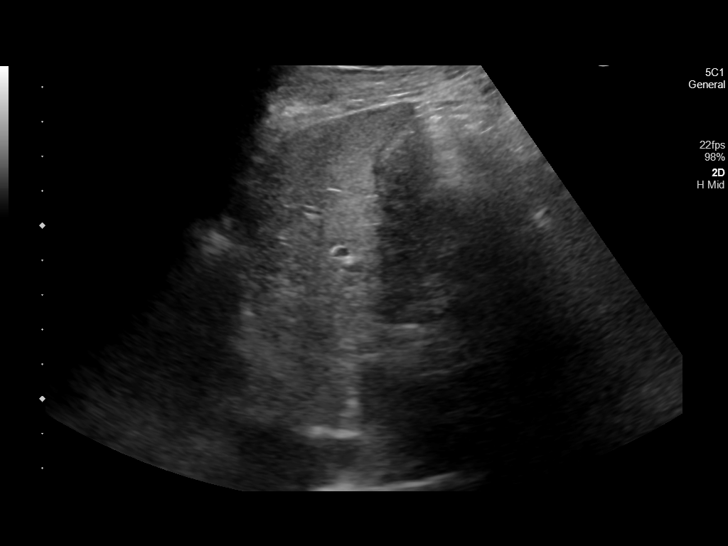
[im 17/27]
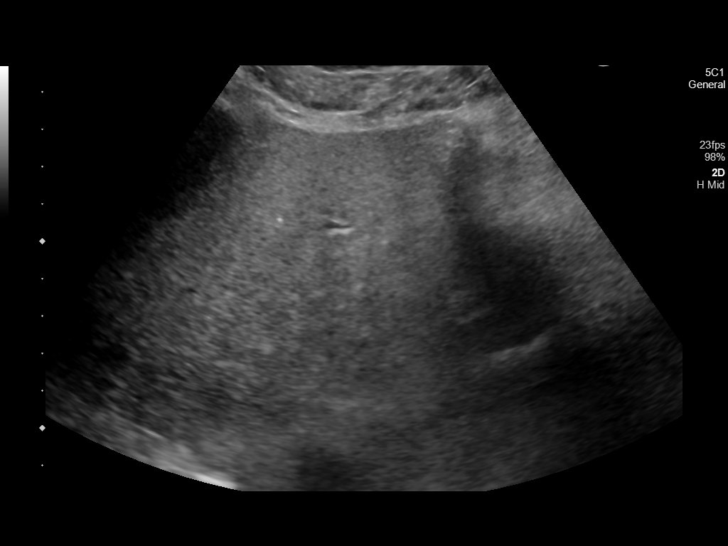
[im 18/27]
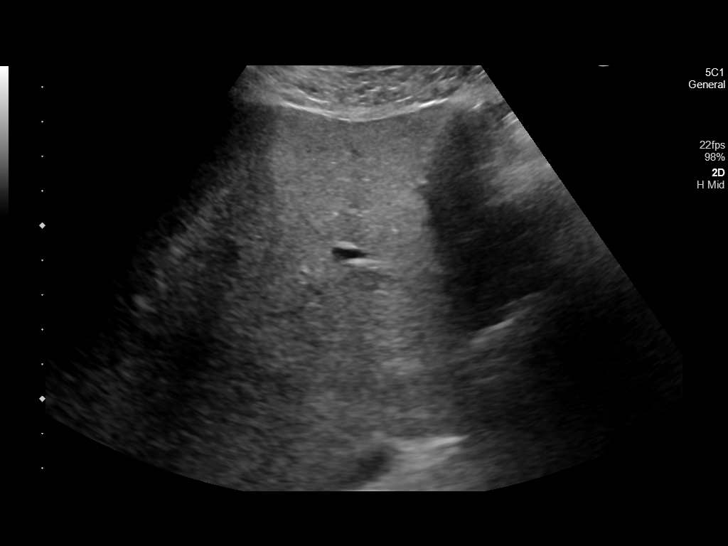
[im 20/27]
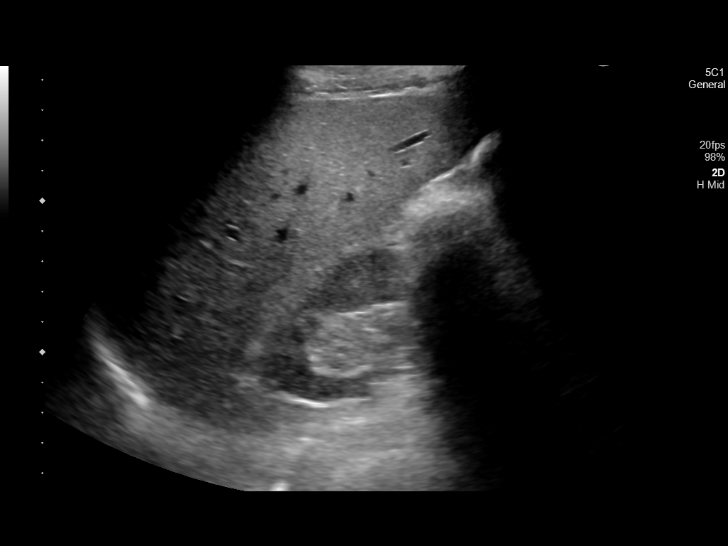
[im 22/27]
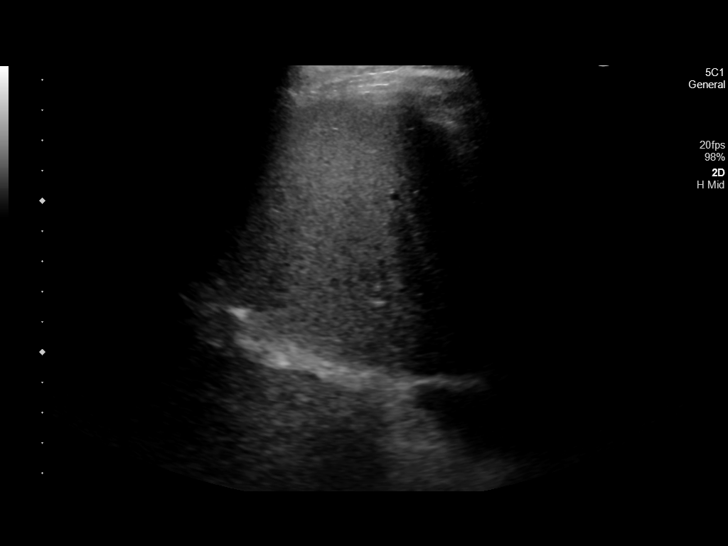
[im 24/27]
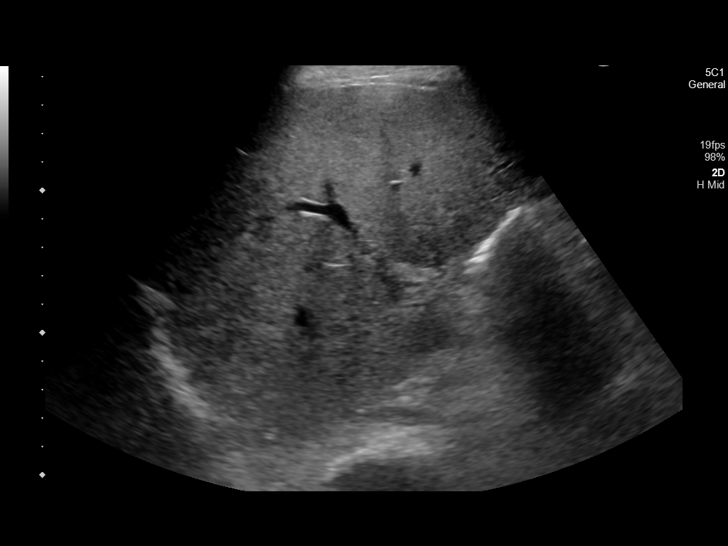
[im 27/27]
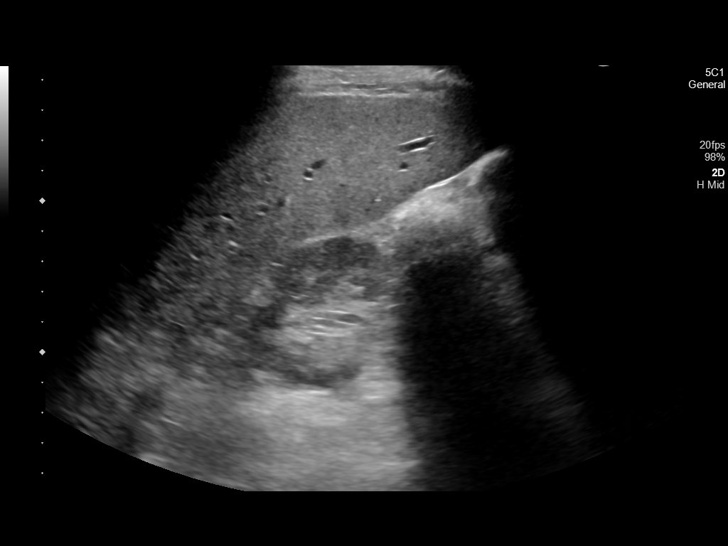

[14 of 25 positions shown; findings below may reference images not displayed]

FINDINGS: Gallbladder:

Gallbladder is well distended with sludge within. No gallstones are
identified. No wall thickening or pericholecystic fluid is noted.

Common bile duct:

Diameter: 3.5 mm.

Liver:

Increased in echogenicity consistent with fatty infiltration. Portal
vein is patent on color Doppler imaging with normal direction of
blood flow towards the liver.

Other: None.
IMPRESSION: Fatty liver similar to that seen on prior CT.

Gallbladder well distended with sludge.

## 2022-09-25 ENCOUNTER — Other Ambulatory Visit: Payer: Self-pay

## 2022-09-25 ENCOUNTER — Emergency Department: Payer: 59

## 2022-09-25 ENCOUNTER — Ambulatory Visit: Admission: EM | Admit: 2022-09-25 | Discharge: 2022-09-25 | Discharge status: Against Medical Advice | Payer: 59

## 2022-09-25 ENCOUNTER — Emergency Department
Admission: EM | Admit: 2022-09-25 | Discharge: 2022-09-25 | Disposition: A | Discharge status: Home / Self Care | Payer: 59 | Attending: Student in an Organized Health Care Education/Training Program | Admitting: Student in an Organized Health Care Education/Training Program

## 2022-09-25 ENCOUNTER — Encounter: Payer: Self-pay | Admitting: Emergency Medicine

## 2022-09-25 DIAGNOSIS — R0789 Other chest pain: Secondary | ICD-10-CM | POA: Insufficient documentation

## 2022-09-25 DIAGNOSIS — R079 Chest pain, unspecified: Secondary | ICD-10-CM | POA: Diagnosis present

## 2022-09-25 DIAGNOSIS — R0602 Shortness of breath: Secondary | ICD-10-CM | POA: Insufficient documentation

## 2022-09-25 LAB — BASIC METABOLIC PANEL
Anion gap: 5 (ref 5–15)
BUN: 16 mg/dL (ref 6–20)
CO2: 28 mmol/L (ref 22–32)
Calcium: 9.3 mg/dL (ref 8.9–10.3)
Chloride: 108 mmol/L (ref 98–111)
Creatinine, Ser: 1.18 mg/dL (ref 0.61–1.24)
GFR, Estimated: >60 mL/min (ref >60)
Glucose, Bld: 100 mg/dL — ABNORMAL HIGH (ref 70–99)
Potassium: 4.6 mmol/L (ref 3.5–5.1)
Sodium: 141 mmol/L (ref 135–145)

## 2022-09-25 LAB — TROPONIN I (HIGH SENSITIVITY)
Troponin I (High Sensitivity): 15 ng/L (ref <18)
Troponin I (High Sensitivity): 12 ng/L (ref <18)

## 2022-09-25 LAB — CBC
HCT: 48.1 % (ref 39.0–52.0)
Hemoglobin: 15.4 g/dL (ref 13.0–17.0)
MCH: 29.6 pg (ref 26.0–34.0)
MCHC: 32 g/dL (ref 30.0–36.0)
MCV: 92.5 fL (ref 80.0–100.0)
Platelets: 167 10*3/uL (ref 150–400)
RBC: 5.2 MIL/uL (ref 4.22–5.81)
RDW: 12.8 % (ref 11.5–15.5)
WBC: 6.6 10*3/uL (ref 4.0–10.5)
nRBC: 0 % (ref 0.0–0.2)

## 2022-09-25 LAB — HEPATIC FUNCTION PANEL
ALT: 18 U/L (ref 0–44)
AST: 17 U/L (ref 15–41)
Albumin: 4 g/dL (ref 3.5–5.0)
Alkaline Phosphatase: 61 U/L (ref 38–126)
Bilirubin, Direct: <0.1 mg/dL (ref 0.0–0.2)
Total Bilirubin: 0.6 mg/dL (ref 0.3–1.2)
Total Protein: 7 g/dL (ref 6.5–8.1)

## 2022-09-25 LAB — LIPASE, BLOOD: Lipase: 24 U/L (ref 11–51)

## 2022-09-25 MED ORDER — OXYCODONE-ACETAMINOPHEN 5-325 MG PO TABS
1.0000 | ORAL_TABLET | Freq: Once | ORAL | Status: AC
  Administered 2022-09-25: 1 via ORAL
  Filled 2022-09-25: qty 1

## 2022-09-25 MED ORDER — IOHEXOL 350 MG/ML SOLN
75.0000 mL | Freq: Once | INTRAVENOUS | Status: AC | PRN
  Administered 2022-09-25: 75 mL via INTRAVENOUS

## 2022-09-25 NOTE — ED Triage Notes (Signed)
Pt to ED for centralized chest pain that started a couple weeks ago, pain increasing on Friday. Pain radiating down both arms and into jaw  +shob

## 2022-09-25 NOTE — ED Provider Triage Note (Signed)
Emergency Medicine Provider Triage Evaluation Note  Isaac Jackson , a 51 y.o. male  was evaluated in triage.  Pt complains of midsternal chest pain with shortness of breath x 2 weeks. Pain increased on Friday. Now radiating down both arms and into jaw.  Physical Exam  BP (!) 124/93   Pulse 78   Temp 97.8 F (36.6 C)   Resp 18   Ht 5\' 7"  (1.702 m)   Wt 62.1 kg   SpO2 100%   BMI 21.46 kg/m  Gen:   Awake, no distress   Resp:  Normal effort  MSK:   Moves extremities without difficulty  Other:    Medical Decision Making  Medically screening exam initiated at 1:07 PM.  Appropriate orders placed.  Isaac Jackson was informed that the remainder of the evaluation will be completed by another provider, this initial triage assessment does not replace that evaluation, and the importance of remaining in the ED until their evaluation is complete.   [name], FNP 09/25/22 1904

## 2022-09-25 NOTE — ED Provider Notes (Signed)
[facility] Provider Note    Event Date/Time   First MD Initiated Contact with Patient 09/25/22 1744     (approximate)   History   Chest Pain   HPI  Isaac Jackson is a 51 y.o. male presents the ER for evaluation of several weeks of midsternal chest pain aching in nature radiating to his jaw and arms.  States over the past few days its gotten progressively worse.  Has had some brief episodes of shortness of breath with this.  Not on any blood thinners.  Denies any abdominal pain.  No inciting events.  No history of coronary disease.     Physical Exam   Triage Vital Signs: ED Triage Vitals  Enc Vitals Group     BP 09/25/22 1236 (!) 124/93     Pulse Rate 09/25/22 1236 78     Resp 09/25/22 1236 18     Temp 09/25/22 1236 97.8 F (36.6 C)     Temp Source 09/25/22 1720 Oral     SpO2 09/25/22 1236 100 %     Weight 09/25/22 1243 137 lb (62.1 kg)     Height 09/25/22 1243 5\' 7"  (1.702 m)     Head Circumference --      Peak Flow --      Pain Score 09/25/22 1243 8     Pain Loc --      Pain Edu? --      Excl. in GC? --     Most recent vital signs: Vitals:   09/25/22 1236 09/25/22 1720  BP: (!) 124/93 (!) 126/100  Pulse: 78 75  Resp: 18 18  Temp: 97.8 F (36.6 C) 98.3 F (36.8 C)  SpO2: 100% 99%     Constitutional: Alert  Eyes: Conjunctivae are normal.  Head: Atraumatic. Nose: No congestion/rhinnorhea. Mouth/Throat: Mucous membranes are moist.   Neck: Painless ROM.  Cardiovascular:   Good peripheral circulation. Respiratory: Normal respiratory effort.  No retractions.  Gastrointestinal: Soft and nontender in all four quadrants  Musculoskeletal:  no deformity Neurologic:  MAE spontaneously. No gross focal neurologic deficits are appreciated.  Skin:  Skin is warm, dry and intact. No rash noted. Psychiatric: Mood and affect are normal. Speech and behavior are normal.    ED Results / Procedures / Treatments   Labs (all labs ordered  are listed, but only abnormal results are displayed) Labs Reviewed  BASIC METABOLIC PANEL - Abnormal; Notable for the following components:      Result Value   Glucose, Bld 100 (*)    All other components within normal limits  CBC  HEPATIC FUNCTION PANEL  LIPASE, BLOOD  TROPONIN I (HIGH SENSITIVITY)  TROPONIN I (HIGH SENSITIVITY)     EKG  ED ECG REPORT I, 14/04/23, the attending physician, personally viewed and interpreted this ECG.   Date: 09/25/2022  EKG Time: 12:39  Rate: 80  Rhythm: sinus  Axis: normal  Intervals: normal   ST&T Change: no stemi, non specific st abn, unchanged from previous    RADIOLOGY Please see ED Course for my review and interpretation.  I personally reviewed all radiographic images ordered to evaluate for the above acute complaints and reviewed radiology reports and findings.  These findings were personally discussed with the patient.  Please see medical record for radiology report.    PROCEDURES:  Critical Care performed: No  Procedures   MEDICATIONS ORDERED IN ED: Medications  oxyCODONE-acetaminophen (PERCOCET/ROXICET) 5-325 MG per tablet 1 tablet (1 tablet Oral Given  09/25/22 1815)  iohexol (OMNIPAQUE) 350 MG/ML injection 75 mL (75 mLs Intravenous Contrast Given 09/25/22 1826)     IMPRESSION / MDM / ASSESSMENT AND PLAN / ED COURSE  I reviewed the triage vital signs and the nursing notes.                              Differential diagnosis includes, but is not limited to, ACS, pericarditis, esophagitis, boerhaaves, pe, dissection, pna, bronchitis, costochondritis  Patient presenting to the ER for evaluation of symptoms as described above.  Based on symptoms, risk factors and considered above differential, this presenting complaint could reflect a potentially life-threatening illness therefore the patient will be placed on continuous pulse oximetry and telemetry for monitoring.  Laboratory evaluation will be sent to evaluate for  the above complaints.  CT imaging ordered for the above differential.  Chest x-ray on my review and interpretation does not show any evidence of pneumothorax or consolidation.  Initial troponin is negative.  His EKG is unchanged from previous.  His abdominal exam is soft benign.  Otherwise low risk by heart score after observation in the ER is currently describing very minimal discomfort.  CT imaging without acute acute finding to explain his pain does not seem consistent with dissection.  Not consistent with ACS or unstable angina.  Appears stable and appropriate for outpatient follow-up with cardiology.        FINAL CLINICAL IMPRESSION(S) / ED DIAGNOSES   Final diagnoses:  Atypical chest pain     Rx / DC Orders   ED Discharge Orders          Ordered    Ambulatory referral to Cardiology       Comments: If you have not heard from the Cardiology office within the next 72 hours please call [phone].   09/25/22 1856             Note:  This document was prepared using Dragon voice recognition software and may include unintentional dictation errors.    Willy Eddy, MD 09/25/22 [phone]

## 2022-09-29 ENCOUNTER — Other Ambulatory Visit: Payer: Self-pay

## 2022-09-29 ENCOUNTER — Emergency Department
Admission: EM | Admit: 2022-09-29 | Discharge: 2022-09-29 | Disposition: A | Discharge status: Home / Self Care | Payer: 59 | Attending: Emergency Medicine | Admitting: Emergency Medicine

## 2022-09-29 ENCOUNTER — Encounter: Payer: Self-pay | Admitting: Emergency Medicine

## 2022-09-29 ENCOUNTER — Emergency Department: Payer: 59

## 2022-09-29 DIAGNOSIS — I10 Essential (primary) hypertension: Secondary | ICD-10-CM | POA: Diagnosis not present

## 2022-09-29 DIAGNOSIS — R079 Chest pain, unspecified: Secondary | ICD-10-CM | POA: Diagnosis present

## 2022-09-29 DIAGNOSIS — Z7982 Long term (current) use of aspirin: Secondary | ICD-10-CM | POA: Insufficient documentation

## 2022-09-29 DIAGNOSIS — J45909 Unspecified asthma, uncomplicated: Secondary | ICD-10-CM | POA: Diagnosis not present

## 2022-09-29 LAB — CBC WITH DIFFERENTIAL/PLATELET
Abs Immature Granulocytes: 0.04 10*3/uL (ref 0.00–0.07)
Basophils Absolute: 0 10*3/uL (ref 0.0–0.1)
Basophils Relative: 1 %
Eosinophils Absolute: 0.1 10*3/uL (ref 0.0–0.5)
Eosinophils Relative: 2 %
HCT: 54.8 % — ABNORMAL HIGH (ref 39.0–52.0)
Hemoglobin: 18.1 g/dL — ABNORMAL HIGH (ref 13.0–17.0)
Immature Granulocytes: 1 %
Lymphocytes Relative: 21 %
Lymphs Abs: 1.4 10*3/uL (ref 0.7–4.0)
MCH: 30.6 pg (ref 26.0–34.0)
MCHC: 33 g/dL (ref 30.0–36.0)
MCV: 92.7 fL (ref 80.0–100.0)
Monocytes Absolute: 0.3 10*3/uL (ref 0.1–1.0)
Monocytes Relative: 5 %
Neutro Abs: 4.6 10*3/uL (ref 1.7–7.7)
Neutrophils Relative %: 70 %
Platelets: 176 10*3/uL (ref 150–400)
RBC: 5.91 MIL/uL — ABNORMAL HIGH (ref 4.22–5.81)
RDW: 12.7 % (ref 11.5–15.5)
WBC: 6.6 10*3/uL (ref 4.0–10.5)
nRBC: 0 % (ref 0.0–0.2)

## 2022-09-29 LAB — COMPREHENSIVE METABOLIC PANEL
ALT: 20 U/L (ref 0–44)
AST: 17 U/L (ref 15–41)
Albumin: 4.2 g/dL (ref 3.5–5.0)
Alkaline Phosphatase: 62 U/L (ref 38–126)
Anion gap: 9 (ref 5–15)
BUN: 16 mg/dL (ref 6–20)
CO2: 26 mmol/L (ref 22–32)
Calcium: 9 mg/dL (ref 8.9–10.3)
Chloride: 104 mmol/L (ref 98–111)
Creatinine, Ser: 1.22 mg/dL (ref 0.61–1.24)
GFR, Estimated: >60 mL/min (ref >60)
Glucose, Bld: 113 mg/dL — ABNORMAL HIGH (ref 70–99)
Potassium: 4.3 mmol/L (ref 3.5–5.1)
Sodium: 139 mmol/L (ref 135–145)
Total Bilirubin: 0.6 mg/dL (ref 0.3–1.2)
Total Protein: 7.1 g/dL (ref 6.5–8.1)

## 2022-09-29 LAB — TROPONIN I (HIGH SENSITIVITY): Troponin I (High Sensitivity): 8 ng/L (ref <18)

## 2022-09-29 MED ORDER — OXYCODONE HCL 5 MG PO TABS: 5.0000 mg | ORAL_TABLET | Freq: Three times a day (TID) | ORAL | 0 refills | Status: DC | PRN

## 2022-09-29 MED ORDER — NITROGLYCERIN 0.4 MG SL SUBL: 0.4000 mg | SUBLINGUAL_TABLET | SUBLINGUAL | Status: DC | PRN

## 2022-09-29 MED ORDER — DOXYCYCLINE HYCLATE 50 MG PO CAPS: 100.0000 mg | ORAL_CAPSULE | Freq: Two times a day (BID) | ORAL | 0 refills | Status: AC

## 2022-09-29 MED ORDER — FAMOTIDINE 20 MG PO TABS: 20.0000 mg | ORAL_TABLET | Freq: Two times a day (BID) | ORAL | 1 refills | Status: DC

## 2022-09-29 MED ORDER — ASPIRIN 81 MG PO CHEW
324.0000 mg | CHEWABLE_TABLET | Freq: Once | ORAL | Status: AC
  Administered 2022-09-29: 324 mg via ORAL
  Filled 2022-09-29: qty 4

## 2022-09-29 NOTE — ED Provider Notes (Signed)
[facility] Provider Note    Event Date/Time   First MD Initiated Contact with Patient 09/29/22 1508     (approximate)   History   Chest Pain   HPI  Isaac Jackson is a 51 y.o. male   Past medical history of remote alcohol use, hypertension, kidney stone, asthma who presents to the emergency department with ongoing chest pain for the last several weeks.  It is unchanged in quality or severity and also involves his lower throat and left arm.  It is nonexertional.  There is no obvious exacerbating or alleviating factors.  He has no associated shortness of breath.  He does have a cough productive of sputum for the last several weeks as well.  No fever.    He was evaluated in the emergency department recently for the same symptoms.  He has ongoing neurologic complaints that he is seeing outpatient neurologist for.  Those are unchanged today and he is here for his chest pain only.  History was obtained via the patient. I reviewed external medical notes including a CT angiogram of the chest performed on 09/25/2022 with no evidence of pulmonary embolism and a normal-appearing thoracic aorta. I also reviewed another CT chest abdomen pelvis with IV contrast performed on 09/11/2022 with no acute pathology in the chest abdomen or pelvis; on 09/10/2022 he had an MRI of the cervical spine with and without contrast with no emergent pathologies.  These imaging studies were ordered by his neurologist for ataxia and difficulty with coordination neurologic complaints.     Physical Exam   Triage Vital Signs: ED Triage Vitals  Enc Vitals Group     BP 09/29/22 1145 (!) 149/105     Pulse Rate 09/29/22 1145 82     Resp 09/29/22 1145 16     Temp 09/29/22 1145 98.2 F (36.8 C)     Temp Source 09/29/22 1145 Oral     SpO2 09/29/22 1145 100 %     Weight 09/29/22 1453 137 lb 9.1 oz (62.4 kg)     Height 09/29/22 1453 5\' 7"  (1.702 m)     Head Circumference --      Peak Flow --       Pain Score 09/29/22 1151 7     Pain Loc --      Pain Edu? --      Excl. in GC? --     Most recent vital signs: Vitals:   09/29/22 1145  BP: (!) 149/105  Pulse: 82  Resp: 16  Temp: 98.2 F (36.8 C)  SpO2: 100%    General: Awake, no distress.  CV:  Good peripheral perfusion.  Resp:  Normal effort.  Abd:  No distention.  Other:  Awake alert and comfortable appearing, with a soft and nontender abdomen, clear lung sounds without focality or wheezing, intact and equal radial pulses bilaterally   ED Results / Procedures / Treatments   Labs (all labs ordered are listed, but only abnormal results are displayed) Labs Reviewed  CBC WITH DIFFERENTIAL/PLATELET - Abnormal; Notable for the following components:      Result Value   RBC 5.91 (*)    Hemoglobin 18.1 (*)    HCT 54.8 (*)    All other components within normal limits  COMPREHENSIVE METABOLIC PANEL - Abnormal; Notable for the following components:   Glucose, Bld 113 (*)    All other components within normal limits  TROPONIN I (HIGH SENSITIVITY)  TROPONIN I (HIGH SENSITIVITY)  I reviewed labs and they are notable for hemoglobin is 18.1 and troponin is 8  EKG  ED ECG REPORT I, Pilar Jarvis, the attending physician, personally viewed and interpreted this ECG.   Date: 09/29/2022  EKG Time: 1142  Rate: 88  Rhythm: incomplete rbbb  Axis: nl  Intervals:incomplete rbbb  ST&T Change: No ischemic changes.    RADIOLOGY I independently reviewed and interpreted chest x-ray and see no obvious focalities or pneumothorax.   PROCEDURES:  Critical Care performed: No  Procedures   MEDICATIONS ORDERED IN ED: Medications  nitroGLYCERIN (NITROSTAT) SL tablet 0.4 mg (has no administration in time range)  aspirin chewable tablet 324 mg (324 mg Oral Given 09/29/22 1155)   IMPRESSION / MDM / ASSESSMENT AND PLAN / ED COURSE  I reviewed the triage vital signs and the nursing notes.                               Differential diagnosis includes, but is not limited to, ACS, PE, dissection, GERD, atypical pneumonia, pneumothorax   The patient is on the cardiac monitor to evaluate for evidence of arrhythmia and/or significant heart rate changes.  MDM: Patient with ongoing pain for several weeks unchanged in quality or severity with a benign appearing exam and recent imaging including CT scan of the chest abdomen pelvis as well as a CT angiogram of the chest with no revealing emergent pathologies.  I discussed with him about further assessment today with imaging unlikely to reveal actionable findings and he agrees and defers imaging given unchanged quality of pain.  He has a referral to cardiology which he has yet to connect with.  I reemphasized that he must connect with cardiology for follow-up visit and further testing.  At this time, I doubt emergent pathologies like dissection, ACS, PE, surgical abdomen.   I will give him pain medications. A course of antacids in case this was GERD/gastritis as well as doxycycline for atypical pneumonia.  He will discharge home at this time with strict return precautions for any changes in his symptoms or worsening, and will follow-up with both his PMD and cardiologist.   Patient's presentation is most consistent with acute presentation with potential threat to life or bodily function.       FINAL CLINICAL IMPRESSION(S) / ED DIAGNOSES   Final diagnoses:  Chest pain, unspecified type     Rx / DC Orders   ED Discharge Orders          Ordered    famotidine (PEPCID) 20 MG tablet  2 times daily        09/29/22 1545    doxycycline (VIBRAMYCIN) 50 MG capsule  2 times daily        09/29/22 1545    oxyCODONE (ROXICODONE) 5 MG immediate release tablet  Every 8 hours PRN        09/29/22 1545    Ambulatory referral to Cardiology        09/29/22 1546             Note:  This document was prepared using Dragon voice recognition software and may include  unintentional dictation errors.    Pilar Jarvis, MD 09/29/22 [phone]

## 2022-09-29 NOTE — ED Triage Notes (Signed)
Pt comes with c/o cp that started two weeks ago. Pt was recently seen here 4 days ago for same. Pt states he was informed to follow up with cardiology. Pt states he tried.  Pt states he feels it has gotten worse. Pt states some neurological issues for months now as well. Pt is shaking in triage and states this is coming and going.  Pt little sob. Pt states dizziness and weakness.

## 2022-09-29 NOTE — Discharge Instructions (Signed)
Use medications as prescribed.  Call cardiologist for an appointment  Take acetaminophen 650 mg and ibuprofen 400 mg every 6 hours for pain.  Take with food. Use oxycodone for breakthrough/severe pain as needed.  Thank you for choosing [place] for your health care today!  Please see your primary doctor this week for a follow up appointment.   Sometimes, in the early stages of certain disease courses it is difficult to detect in the emergency department evaluation -- so, it is important that you continue to monitor your symptoms and call your doctor right away or return to the emergency department if you develop any new or worsening symptoms.  Please go to the following website to schedule new (and existing) patient appointments:   [email or web address]  If you do not have a primary doctor try calling the following clinics to establish care:  If you have insurance:  [facility] [phone] [address] [place] [place] [address]   [facility]  [phone] [address] [place]., [place] [address]   If you do not have insurance:  [facility]  [phone] [address], [place] [address]   The following is another list of primary care offices in the area who are accepting new patients at this time.  Please reach out to one of them directly and let them know you would like to schedule an appointment to follow up on an Emergency Department visit, and/or to establish a new primary care provider (PCP).  There are likely other primary care clinics in the are who are accepting new patients, but this is an excellent place to start:  [facility] Lead physician: Dr Shirlee Latch [address] [place], [place] [address] [phone]  [facility] Lead Physician: Dr Alba Cory [address], [place], [place] [address] [phone]  [facility]  Lead  Physician: Dr Olevia Perches [address] [place], [place] [address] [phone]  [facility] Lead Physician: Dr Sofie Hartigan [address] [place], [place], [place] [address] [phone]  [facility] at [facility] Lead Physician: Dr Bari Edward [address] [place], [place] [address] [phone]   It was my pleasure to care for you today.   Daneil Dan Modesto Charon, MD

## 2022-09-29 NOTE — ED Provider Triage Note (Signed)
  Emergency Medicine Provider Triage Evaluation Note  Isaac Jackson , a 51 y.o.male,  was evaluated in triage.  Pt complains of chest pain, jaw pain, and left shoulder pain.  He states that has been going on for the past week.  He was recently seen here on 09/25/2022, where they did a chest pain workup which was ultimately negative.  He was advised to follow-up with cardiology, however they have not reached back out to him after his initial phone call to them.  He states that symptoms have worsened since.  He additionally has developed tremors in his upper extremities as well.   Review of Systems  Positive: Chest pain, jaw pain, left shoulder pain, tremors Negative: Denies fever, abdominal pain, vomiting  Physical Exam   Vitals:   09/29/22 1145  BP: (!) 149/105  Pulse: 82  Resp: 16  Temp: 98.2 F (36.8 C)  SpO2: 100%   Gen:   Awake, appears distressed. Resp:  Normal effort  MSK:   Moves extremities without difficulty  Other:    Medical Decision Making  Given the patient's initial medical screening exam, the following diagnostic evaluation has been ordered. The patient will be placed in the appropriate treatment space, once one is available, to complete the evaluation and treatment. I have discussed the plan of care with the patient and I have advised the patient that an ED physician or mid-level practitioner will reevaluate their condition after the test results have been received, as the results may give them additional insight into the type of treatment they may need.    Diagnostics: Labs, EKG, CXR  Treatments: none immediately   Varney Daily, [place] 09/29/22 1150

## 2023-07-31 ENCOUNTER — Other Ambulatory Visit: Payer: Self-pay | Admitting: Urology

## 2023-07-31 ENCOUNTER — Ambulatory Visit
Admission: RE | Admit: 2023-07-31 | Discharge: 2023-07-31 | Disposition: A | Discharge status: Home / Self Care | Payer: 59 | Source: Ambulatory Visit | Attending: Urology | Admitting: Urology

## 2023-07-31 DIAGNOSIS — N23 Unspecified renal colic: Secondary | ICD-10-CM | POA: Diagnosis present

## 2023-07-31 MED ORDER — OXYCODONE HCL 5 MG PO TABS: 5.0000 mg | ORAL_TABLET | Freq: Four times a day (QID) | ORAL | 0 refills | Status: DC | PRN

## 2023-07-31 NOTE — Progress Notes (Signed)
History recurrent nephrolithiasis with left renal colic.  OR employee.  Renal stone CT ordered

## 2023-07-31 NOTE — Progress Notes (Signed)
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  Onset left renal colic and CT performed today shows a 9 mm left proximal ureteral calculus with moderate-severe hydronephrosis/hydroureter.  Limited Rx oxycodone sent to pharmacy.  He will see me tomorrow morning in the office at 10 AM to discuss options.

## 2023-08-01 ENCOUNTER — Encounter: Payer: Self-pay | Admitting: Urology

## 2023-08-01 ENCOUNTER — Other Ambulatory Visit: Payer: Self-pay

## 2023-08-01 ENCOUNTER — Ambulatory Visit: Payer: 59 | Admitting: Urology

## 2023-08-01 VITALS — BP 128/91 | HR 91 | Ht 67.0 in | Wt 138.0 lb

## 2023-08-01 DIAGNOSIS — N201 Calculus of ureter: Secondary | ICD-10-CM

## 2023-08-01 DIAGNOSIS — N23 Unspecified renal colic: Secondary | ICD-10-CM

## 2023-08-01 DIAGNOSIS — N202 Calculus of kidney with calculus of ureter: Secondary | ICD-10-CM

## 2023-08-01 DIAGNOSIS — N132 Hydronephrosis with renal and ureteral calculous obstruction: Secondary | ICD-10-CM

## 2023-08-01 DIAGNOSIS — N200 Calculus of kidney: Secondary | ICD-10-CM

## 2023-08-01 MED ORDER — ONDANSETRON HCL 4 MG/2ML IJ SOLN
4.0000 mg | Freq: Once | INTRAMUSCULAR | Status: AC
  Administered 2023-08-02: 4 mg via INTRAVENOUS

## 2023-08-01 MED ORDER — ONDANSETRON 4 MG PO TBDP: 4.0000 mg | ORAL_TABLET | Freq: Three times a day (TID) | ORAL | 0 refills | Status: DC | PRN

## 2023-08-01 MED ORDER — CEPHALEXIN 500 MG PO CAPS
500.0000 mg | ORAL_CAPSULE | Freq: Once | ORAL | Status: AC
  Administered 2023-08-02: 500 mg via ORAL

## 2023-08-01 MED ORDER — SODIUM CHLORIDE 0.9 % IV SOLN: INTRAVENOUS | Status: DC

## 2023-08-01 MED ORDER — DIAZEPAM 5 MG PO TABS
10.0000 mg | ORAL_TABLET | ORAL | Status: AC
  Administered 2023-08-02: 10 mg via ORAL

## 2023-08-01 MED ORDER — DIPHENHYDRAMINE HCL 25 MG PO CAPS
25.0000 mg | ORAL_CAPSULE | ORAL | Status: AC
  Administered 2023-08-02: 25 mg via ORAL

## 2023-08-01 NOTE — Progress Notes (Signed)
[facility] ORDER FORM  Expected date of procedure: 08/02/2023  Surgeon: Irineo Axon, MD  Post op standing: 2-4wk follow up w/KUB prior  Anticoagulation/Aspirin/NSAID standing order: Hold all 72 hours prior  Anesthesia standing order: MAC  VTE standing: SCD's  Dx: Left Ureteral Stone  Procedure: left Extracorporeal shock wave lithotripsy  CPT : 50590  Standing Order Set:   *NPO after mn, KUB  *NS 172ml/hr, Keflex 500mg  PO, Benadryl 25mg  PO, Valium 10mg  PO, Zofran 4mg  IV  Medications if other than standing orders:   NONE

## 2023-08-01 NOTE — Progress Notes (Signed)
I, [name], acting as a scribe for Riki Altes, MD., have documented all relevant documentation on the behalf of Riki Altes, MD, as directed by  Riki Altes, MD while in the presence of Riki Altes, MD.  10/09/024 2:07 PM   Isaac Jackson 04-28-1971 [id number]   Chief Complaint  Patient presents with   Other    HPI: Isaac Jackson is a 52 y.o. male presents for evaluation of renal colic.  Selk history of recurrent nephrolithiasis starting at age 30. He has had 2 previous shockwave lithotripsies and ureteroscopy/stent placement x6. He had onset of moderate left flank pain 07/29/2023. He had no pain the following day, however the evening of 07/30/23 he had increasing left flank pain radiating to the left groin region. He has had nausea without vomiting.  Has had chills when the pain has been severe, but no fever.  The stone protocol CT was ordered yesterday, which shows a 9mm left proximal ureteral calculus with moderate-severe hydronephrosis-hydroureter. There are also bilateral, non-obstructing renal calculi. Rx oxycodone was sent yesterday evening, which has been controlling the pain.   PMH: Past Medical History:  Diagnosis Date   Asthma    History of alcohol abuse    patient reports that he is an alcoholic and goes to AA   Hypertension    Kidney stone    Renal disorder     Surgical History: Past Surgical History:  Procedure Laterality Date   ABDOMINAL SURGERY     APPENDECTOMY     HERNIA REPAIR      Home Medications:  Allergies as of 08/01/2023   No Known Allergies      Medication List        Accurate as of August 01, 2023  2:07 PM. If you have any questions, ask your nurse or doctor.          chlordiazePOXIDE 25 MG capsule Commonly known as: LIBRIUM Take 1 capsule (25 mg total) by mouth daily.   famotidine 20 MG tablet Commonly known as: PEPCID Take 1 tablet (20 mg total) by mouth 2 (two) times daily.   hydrOXYzine  25 MG tablet Commonly known as: ATARAX Take 1 tablet (25 mg total) by mouth every 8 (eight) hours as needed.   losartan 100 MG tablet Commonly known as: Cozaar Take 1 tablet (100 mg total) by mouth daily.   meclizine 25 MG tablet Commonly known as: ANTIVERT Take 1 tablet (25 mg total) by mouth 3 (three) times daily as needed for dizziness.   ondansetron 4 MG tablet Commonly known as: ZOFRAN Take 1 tablet (4 mg total) by mouth every 6 (six) hours as needed for nausea or vomiting.   oxyCODONE 5 MG immediate release tablet Commonly known as: Roxicodone Take 1-2 tablets (5-10 mg total) by mouth every 6 (six) hours as needed for up to 8 doses.   tamsulosin 0.4 MG Caps capsule Commonly known as: FLOMAX Take 1 capsule (0.4 mg total) by mouth daily.        Allergies: No Known Allergies  Family History: Family History  Problem Relation Age of Onset   Diabetes Mother    Prostate cancer Father    Alcohol abuse Father     Social History:  reports that he has quit smoking. His smoking use included cigarettes. He has never used smokeless tobacco. He reports current alcohol use. He reports that he does not use drugs.   Physical Exam: BP (!) 128/91   Pulse  91   Ht 5\' 7"  (1.702 m)   Wt 138 lb (62.6 kg)   BMI 21.61 kg/m   Constitutional:  Alert and oriented, No acute distress. HEENT: [place] AT Respiratory: Normal respiratory effort, no increased work of breathing. Psychiatric: Normal mood and affect.  Pertinent Imaging: CT was personally reviewed and interpreted.   CT RENAL STONE STUDY  Narrative CLINICAL DATA:  Recurrent nephrolithiasis, left renal colic  EXAM: CT ABDOMEN AND PELVIS WITHOUT CONTRAST  TECHNIQUE: Multidetector CT imaging of the abdomen and pelvis was performed following the standard protocol without IV contrast.  RADIATION DOSE REDUCTION: This exam was performed according to the departmental dose-optimization program which includes automated exposure  control, adjustment of the mA and/or kV according to patient size and/or use of iterative reconstruction technique.  COMPARISON:  CT chest abdomen pelvis, 09/11/2022  FINDINGS: Lower chest: No acute abnormality. Small hiatal hernia, probably status post Nissen fundoplication.  Hepatobiliary: No solid liver abnormality is seen. No gallstones, gallbladder wall thickening, or biliary dilatation.  Pancreas: Unremarkable. No pancreatic ductal dilatation or surrounding inflammatory changes.  Spleen: Normal in size without significant abnormality.  Adrenals/Urinary Tract: Adrenal glands are unremarkable. There is a 0.8 cm calculus in the proximal third of the left ureter with severe associated hydroureter. Multiple additional nonobstructive bilateral renal calculi. No right-sided hydronephrosis. Bladder is unremarkable.  Stomach/Bowel: Stomach is within normal limits. Appendix appears normal. No evidence of bowel wall thickening, distention, or inflammatory changes. Sigmoid diverticulosis. Large burden of stool throughout the colon and rectum  Vascular/Lymphatic: No significant vascular findings are present. No enlarged abdominal or pelvic lymph nodes.  Reproductive: No mass or other significant abnormality.  Other: Evidence of prior hernia mesh repair.  No ascites.  Musculoskeletal: No acute or significant osseous findings.  IMPRESSION: 1. There is a 0.8 cm calculus in the proximal third of the left ureter with severe associated hydroureter. 2. Multiple additional nonobstructive bilateral renal calculi. 3. Sigmoid diverticulosis without evidence of acute diverticulitis. 4. Large burden of stool throughout the colon and rectum.  These results will be called to the ordering clinician or representative by the Radiologist Assistant, and communication documented in the PACS or [employer].   Electronically Signed By: Jearld Lesch M.D. On: 07/31/2023 17:26   Assessment  & Plan:    1. Left proximal ureteral calculus with obstruction Renal colic secondary to above; currently managed on oral noxicodone.  We discussed various treatment options for urolithiasis including observation with or without medical expulsive therapy, shockwave lithotripsy (SWL), ureteroscopy and laser lithotripsy with stent placement. We discussed that management is based on stone size, location, density, patient co-morbidities, and patient preference.  Stones <16mm in size have a >80% spontaneous passage rate. Data surrounding the use of tamsulosin for medical expulsive therapy is controversial, but meta analyses suggests it is most efficacious for distal stones between 5-63mm in size. Possible side effects include dizziness/lightheadedness, and retrograde ejaculation. SWL has a lower stone free rate in a single procedure, but also a lower complication rate compared to ureteroscopy and avoids a stent and associated stent related symptoms. Possible complications include renal hematoma, steinstrasse, and need for additional treatment. Ureteroscopy with laser lithotripsy and stent placement has a higher stone free rate than SWL in a single procedure, however increased complication rate including possible infection, ureteral injury, bleeding, and stent related morbidity. Common stent related symptoms include dysuria, urgency/frequency, and flank pain. After an extensive discussion of the risks and benefits of the above treatment options, the patient would  like to proceed with SWL. His stone density is 1300 but SSD <10 centimeters. We discussed based on stone density SWL would be less effective and it could take several days to pass stone fragments. We also discussed he may have incomplete stone fragmentation and require a second SWL or alternative procedure,  i.e. ureteroscopy or stent placement and or ureteroscopy. All questions were answered and he desires to proceed. Rx ondansetron-ODT sent to  pharmacy  2. Bilateral nephrolithiasis Non-obstructing bilateral urinal calculi.  Discussed metabolic evaluation after current acute episode resolves.   I have reviewed the above documentation for accuracy and completeness, and I agree with the above.   Riki Altes, MD  [facility] [address], [address] [place], [place] [address] [phone]

## 2023-08-01 NOTE — H&P (View-Only) (Signed)
I, Isaac Jackson, acting as a scribe for Riki Altes, MD., have documented all relevant documentation on the behalf of Riki Altes, MD, as directed by  Riki Altes, MD while in the presence of Riki Altes, MD.  10/09/024 2:07 PM   Isaac Jackson 04-28-1971 [id number]   Chief Complaint  Patient presents with   Other    HPI: Isaac Jackson is a 52 y.o. male presents for evaluation of renal colic.  Selk history of recurrent nephrolithiasis starting at age 30. He has had 2 previous shockwave lithotripsies and ureteroscopy/stent placement x6. He had onset of moderate left flank pain 07/29/2023. He had no pain the following day, however the evening of 07/30/23 he had increasing left flank pain radiating to the left groin region. He has had nausea without vomiting.  Has had chills when the pain has been severe, but no fever.  The stone protocol CT was ordered yesterday, which shows a 9mm left proximal ureteral calculus with moderate-severe hydronephrosis-hydroureter. There are also bilateral, non-obstructing renal calculi. Rx oxycodone was sent yesterday evening, which has been controlling the pain.   PMH: Past Medical History:  Diagnosis Date   Asthma    History of alcohol abuse    patient reports that he is an alcoholic and goes to AA   Hypertension    Kidney stone    Renal disorder     Surgical History: Past Surgical History:  Procedure Laterality Date   ABDOMINAL SURGERY     APPENDECTOMY     HERNIA REPAIR      Home Medications:  Allergies as of 08/01/2023   No Known Allergies      Medication List        Accurate as of August 01, 2023  2:07 PM. If you have any questions, ask your nurse or doctor.          chlordiazePOXIDE 25 MG capsule Commonly known as: LIBRIUM Take 1 capsule (25 mg total) by mouth daily.   famotidine 20 MG tablet Commonly known as: PEPCID Take 1 tablet (20 mg total) by mouth 2 (two) times daily.   hydrOXYzine  25 MG tablet Commonly known as: ATARAX Take 1 tablet (25 mg total) by mouth every 8 (eight) hours as needed.   losartan 100 MG tablet Commonly known as: Cozaar Take 1 tablet (100 mg total) by mouth daily.   meclizine 25 MG tablet Commonly known as: ANTIVERT Take 1 tablet (25 mg total) by mouth 3 (three) times daily as needed for dizziness.   ondansetron 4 MG tablet Commonly known as: ZOFRAN Take 1 tablet (4 mg total) by mouth every 6 (six) hours as needed for nausea or vomiting.   oxyCODONE 5 MG immediate release tablet Commonly known as: Roxicodone Take 1-2 tablets (5-10 mg total) by mouth every 6 (six) hours as needed for up to 8 doses.   tamsulosin 0.4 MG Caps capsule Commonly known as: FLOMAX Take 1 capsule (0.4 mg total) by mouth daily.        Allergies: No Known Allergies  Family History: Family History  Problem Relation Age of Onset   Diabetes Mother    Prostate cancer Father    Alcohol abuse Father     Social History:  reports that he has quit smoking. His smoking use included cigarettes. He has never used smokeless tobacco. He reports current alcohol use. He reports that he does not use drugs.   Physical Exam: BP (!) 128/91   Pulse  91   Ht 5\' 7"  (1.702 m)   Wt 138 lb (62.6 kg)   BMI 21.61 kg/m   Constitutional:  Alert and oriented, No acute distress. HEENT: [place] AT Respiratory: Normal respiratory effort, no increased work of breathing. Psychiatric: Normal mood and affect.  Pertinent Imaging: CT was personally reviewed and interpreted.   CT RENAL STONE STUDY  Narrative CLINICAL DATA:  Recurrent nephrolithiasis, left renal colic  EXAM: CT ABDOMEN AND PELVIS WITHOUT CONTRAST  TECHNIQUE: Multidetector CT imaging of the abdomen and pelvis was performed following the standard protocol without IV contrast.  RADIATION DOSE REDUCTION: This exam was performed according to the departmental dose-optimization program which includes automated exposure  control, adjustment of the mA and/or kV according to patient size and/or use of iterative reconstruction technique.  COMPARISON:  CT chest abdomen pelvis, 09/11/2022  FINDINGS: Lower chest: No acute abnormality. Small hiatal hernia, probably status post Nissen fundoplication.  Hepatobiliary: No solid liver abnormality is seen. No gallstones, gallbladder wall thickening, or biliary dilatation.  Pancreas: Unremarkable. No pancreatic ductal dilatation or surrounding inflammatory changes.  Spleen: Normal in size without significant abnormality.  Adrenals/Urinary Tract: Adrenal glands are unremarkable. There is a 0.8 cm calculus in the proximal third of the left ureter with severe associated hydroureter. Multiple additional nonobstructive bilateral renal calculi. No right-sided hydronephrosis. Bladder is unremarkable.  Stomach/Bowel: Stomach is within normal limits. Appendix appears normal. No evidence of bowel wall thickening, distention, or inflammatory changes. Sigmoid diverticulosis. Large burden of stool throughout the colon and rectum  Vascular/Lymphatic: No significant vascular findings are present. No enlarged abdominal or pelvic lymph nodes.  Reproductive: No mass or other significant abnormality.  Other: Evidence of prior hernia mesh repair.  No ascites.  Musculoskeletal: No acute or significant osseous findings.  IMPRESSION: 1. There is a 0.8 cm calculus in the proximal third of the left ureter with severe associated hydroureter. 2. Multiple additional nonobstructive bilateral renal calculi. 3. Sigmoid diverticulosis without evidence of acute diverticulitis. 4. Large burden of stool throughout the colon and rectum.  These results will be called to the ordering clinician or representative by the Radiologist Assistant, and communication documented in the PACS or [employer].   Electronically Signed By: Jearld Lesch M.D. On: 07/31/2023 17:26   Assessment  & Plan:    1. Left proximal ureteral calculus with obstruction Renal colic secondary to above; currently managed on oral noxicodone.  We discussed various treatment options for urolithiasis including observation with or without medical expulsive therapy, shockwave lithotripsy (SWL), ureteroscopy and laser lithotripsy with stent placement. We discussed that management is based on stone size, location, density, patient co-morbidities, and patient preference.  Stones <16mm in size have a >80% spontaneous passage rate. Data surrounding the use of tamsulosin for medical expulsive therapy is controversial, but meta analyses suggests it is most efficacious for distal stones between 5-63mm in size. Possible side effects include dizziness/lightheadedness, and retrograde ejaculation. SWL has a lower stone free rate in a single procedure, but also a lower complication rate compared to ureteroscopy and avoids a stent and associated stent related symptoms. Possible complications include renal hematoma, steinstrasse, and need for additional treatment. Ureteroscopy with laser lithotripsy and stent placement has a higher stone free rate than SWL in a single procedure, however increased complication rate including possible infection, ureteral injury, bleeding, and stent related morbidity. Common stent related symptoms include dysuria, urgency/frequency, and flank pain. After an extensive discussion of the risks and benefits of the above treatment options, the patient would  like to proceed with SWL. His stone density is 1300 but SSD <10 centimeters. We discussed based on stone density SWL would be less effective and it could take several days to pass stone fragments. We also discussed he may have incomplete stone fragmentation and require a second SWL or alternative procedure,  i.e. ureteroscopy or stent placement and or ureteroscopy. All questions were answered and he desires to proceed. Rx ondansetron-ODT sent to  pharmacy  2. Bilateral nephrolithiasis Non-obstructing bilateral urinal calculi.  Discussed metabolic evaluation after current acute episode resolves.   I have reviewed the above documentation for accuracy and completeness, and I agree with the above.   Riki Altes, MD  [facility] [address], [address] [place], [place] [address] [phone]

## 2023-08-02 ENCOUNTER — Encounter
Admission: RE | Disposition: A | Discharge status: Home / Self Care | Payer: Self-pay | Source: Home / Self Care | Attending: Urology

## 2023-08-02 ENCOUNTER — Ambulatory Visit
Admission: RE | Admit: 2023-08-02 | Discharge: 2023-08-02 | Disposition: A | Discharge status: Home / Self Care | Payer: 59 | Attending: Urology | Admitting: Urology

## 2023-08-02 ENCOUNTER — Other Ambulatory Visit: Payer: Self-pay

## 2023-08-02 ENCOUNTER — Ambulatory Visit: Payer: 59

## 2023-08-02 ENCOUNTER — Encounter: Payer: Self-pay | Admitting: Urology

## 2023-08-02 DIAGNOSIS — K5730 Diverticulosis of large intestine without perforation or abscess without bleeding: Secondary | ICD-10-CM | POA: Insufficient documentation

## 2023-08-02 DIAGNOSIS — Z87891 Personal history of nicotine dependence: Secondary | ICD-10-CM | POA: Diagnosis not present

## 2023-08-02 DIAGNOSIS — N132 Hydronephrosis with renal and ureteral calculous obstruction: Secondary | ICD-10-CM | POA: Diagnosis present

## 2023-08-02 DIAGNOSIS — I10 Essential (primary) hypertension: Secondary | ICD-10-CM | POA: Insufficient documentation

## 2023-08-02 DIAGNOSIS — N201 Calculus of ureter: Secondary | ICD-10-CM

## 2023-08-02 HISTORY — PX: EXTRACORPOREAL SHOCK WAVE LITHOTRIPSY: SHX1557

## 2023-08-02 SURGERY — LITHOTRIPSY, ESWL
Anesthesia: Moderate Sedation | Laterality: Left

## 2023-08-02 MED ORDER — ONDANSETRON 4 MG PO TBDP
4.0000 mg | ORAL_TABLET | Freq: Three times a day (TID) | ORAL | 0 refills | Status: DC | PRN
  Filled 2023-08-02: qty 20, 7d supply, fill #0

## 2023-08-02 MED ORDER — DIPHENHYDRAMINE HCL 25 MG PO CAPS
ORAL_CAPSULE | ORAL | Status: AC
  Filled 2023-08-02: qty 1

## 2023-08-02 MED ORDER — DIAZEPAM 5 MG PO TABS
ORAL_TABLET | ORAL | Status: AC
  Filled 2023-08-02: qty 2

## 2023-08-02 MED ORDER — CEPHALEXIN 500 MG PO CAPS
ORAL_CAPSULE | ORAL | Status: AC
  Filled 2023-08-02: qty 1

## 2023-08-02 MED ORDER — ONDANSETRON HCL 4 MG/2ML IJ SOLN
INTRAMUSCULAR | Status: AC
  Filled 2023-08-02: qty 2

## 2023-08-02 MED ORDER — OXYCODONE HCL 5 MG PO TABS
5.0000 mg | ORAL_TABLET | Freq: Four times a day (QID) | ORAL | 0 refills | Status: DC | PRN
  Filled 2023-08-02: qty 20, 4d supply, fill #0

## 2023-08-02 NOTE — Discharge Instructions (Signed)
As per the [facility] discharge instructions A prescription for pain and nausea medication was sent to your pharmacy A prescription for tamsulosin which will help you pass stone fragments was also sent to your pharmacy Call [facility] at [phone] for pain not controlled with oral medications or fever greater than 101 degrees You will be contacted for a follow-up appointment in our office.

## 2023-08-02 NOTE — Interval H&P Note (Signed)
History and Physical Interval Note:  08/02/2023 10:25 AM  Isaac Jackson  has presented today for surgery, with the diagnosis of Left Ureteral Stone.  The various methods of treatment have been discussed with the patient and family. After consideration of risks, benefits and other options for treatment, the patient has consented to  Procedure(s): EXTRACORPOREAL SHOCK WAVE LITHOTRIPSY (ESWL) (Left) as a surgical intervention.  The patient's history has been reviewed, patient examined, no change in status, stable for surgery.  I have reviewed the patient's chart and labs.  Questions were answered to the patient's satisfaction.    CV:RRR Lungs:clear   Riki Altes

## 2023-08-03 ENCOUNTER — Other Ambulatory Visit: Payer: Self-pay

## 2023-08-03 DIAGNOSIS — N201 Calculus of ureter: Secondary | ICD-10-CM

## 2023-08-24 ENCOUNTER — Other Ambulatory Visit: Payer: Self-pay

## 2023-08-24 ENCOUNTER — Ambulatory Visit: Payer: 59 | Admitting: Physician Assistant

## 2023-08-24 ENCOUNTER — Ambulatory Visit
Admission: RE | Admit: 2023-08-24 | Discharge: 2023-08-24 | Disposition: A | Discharge status: Home / Self Care | Payer: 59 | Attending: Urology | Admitting: Urology

## 2023-08-24 ENCOUNTER — Ambulatory Visit
Admission: RE | Admit: 2023-08-24 | Discharge: 2023-08-24 | Disposition: A | Discharge status: Home / Self Care | Payer: 59 | Source: Ambulatory Visit | Attending: Urology | Admitting: Urology

## 2023-08-24 VITALS — BP 107/74 | HR 98 | Ht 67.0 in | Wt 140.0 lb

## 2023-08-24 DIAGNOSIS — N201 Calculus of ureter: Secondary | ICD-10-CM

## 2023-08-24 DIAGNOSIS — Z09 Encounter for follow-up examination after completed treatment for conditions other than malignant neoplasm: Secondary | ICD-10-CM

## 2023-08-24 DIAGNOSIS — Z87442 Personal history of urinary calculi: Secondary | ICD-10-CM

## 2023-08-24 LAB — MICROSCOPIC EXAMINATION: Bacteria, UA: NONE SEEN

## 2023-08-24 LAB — URINALYSIS, COMPLETE
Bilirubin, UA: NEGATIVE
Glucose, UA: NEGATIVE
Leukocytes,UA: NEGATIVE
Nitrite, UA: NEGATIVE
Protein,UA: NEGATIVE
Specific Gravity, UA: 1.02 (ref 1.005–1.030)
Urobilinogen, Ur: 0.2 mg/dL (ref 0.2–1.0)
pH, UA: 5.5 (ref 5.0–7.5)

## 2023-08-24 MED ORDER — OXYCODONE HCL 5 MG PO TABS
5.0000 mg | ORAL_TABLET | Freq: Four times a day (QID) | ORAL | 0 refills | Status: DC | PRN
  Filled 2023-08-24: qty 12, 2d supply, fill #0

## 2023-08-24 NOTE — Progress Notes (Signed)
08/24/2023 2:30 PM   Isaac Jackson 1970-11-02 [id number]  CC: Chief Complaint  Patient presents with   Nephrolithiasis   HPI: Isaac Jackson is a 52 y.o. adult with PMH nephrolithiasis who went ESWL with Dr. Lonna Cobb on 08/02/2023 for management of a 9 mm proximal left ureteral stone.  Operative note describes fragmentation of the stone.  Today he reports he passed 1 stone fragment and has had some persistent twinges of pain in his left flank.  KUB today with distal left ureteral Steinstrasse.  In-office UA today positive for trace ketones and trace intact blood; urine microscopy pan negative.  PMH: Past Medical History:  Diagnosis Date   Asthma    History of alcohol abuse    patient reports that he is an alcoholic and goes to AA   Hypertension    Kidney stone    Renal disorder     Surgical History: Past Surgical History:  Procedure Laterality Date   ABDOMINAL SURGERY     APPENDECTOMY     EXTRACORPOREAL SHOCK WAVE LITHOTRIPSY Left 08/02/2023   Procedure: EXTRACORPOREAL SHOCK WAVE LITHOTRIPSY (ESWL);  Surgeon: Riki Altes, MD;  Location: [facility] ORS;  Service: Urology;  Laterality: Left;   HERNIA REPAIR      Home Medications:  Allergies as of 08/24/2023   No Known Allergies      Medication List        Accurate as of August 24, 2023  2:30 PM. If you have any questions, ask your nurse or doctor.          STOP taking these medications    ondansetron 4 MG disintegrating tablet Commonly known as: ZOFRAN-ODT   oxyCODONE 5 MG immediate release tablet Commonly known as: Roxicodone       TAKE these medications    losartan 100 MG tablet Commonly known as: Cozaar Take 1 tablet (100 mg total) by mouth daily.   tamsulosin 0.4 MG Caps capsule Commonly known as: FLOMAX Take 1 capsule (0.4 mg total) by mouth daily.        Allergies:  No Known Allergies  Family History: Family History  Problem Relation Age of Onset   Diabetes Mother     Prostate cancer Father    Alcohol abuse Father     Social History:   reports that she has quit smoking. Her smoking use included cigarettes. She has never used smokeless tobacco. She reports current alcohol use. She reports that she does not use drugs.  Physical Exam: BP 107/74   Pulse 98   Ht 5\' 7"  (1.702 m)   Wt 140 lb (63.5 kg)   BMI 21.93 kg/m   Constitutional:  Alert and oriented, no acute distress, nontoxic appearing HEENT: [place], AT Cardiovascular: No clubbing, cyanosis, or edema Respiratory: Normal respiratory effort, no increased work of breathing Skin: No rashes, bruises or suspicious lesions Neurologic: Grossly intact, no focal deficits, moving all 4 extremities Psychiatric: Normal mood and affect  Laboratory Data: Results for orders placed or performed in visit on 08/24/23  Microscopic Examination   Urine  Result Value Ref Range   WBC, UA 0-5 0 - 5 /hpf   RBC, Urine 0-2 0 - 2 /hpf   Epithelial Cells (non renal) 0-10 0 - 10 /hpf   Bacteria, UA None seen None seen/Few  Urinalysis, Complete  Result Value Ref Range   Specific Gravity, UA 1.020 1.005 - 1.030   pH, UA 5.5 5.0 - 7.5   Color, UA Yellow Yellow   Appearance Ur  Clear Clear   Leukocytes,UA Negative Negative   Protein,UA Negative Negative/Trace   Glucose, UA Negative Negative   Ketones, UA Trace (A) Negative   RBC, UA Trace (A) Negative   Bilirubin, UA Negative Negative   Urobilinogen, Ur 0.2 0.2 - 1.0 mg/dL   Nitrite, UA Negative Negative   Microscopic Examination See below:    Pertinent Imaging: KUB, 08/24/2023: CLINICAL DATA:  Ureteral stone   EXAM: ABDOMEN - 1 VIEW   COMPARISON:  Abdominal x-ray 08/02/2023.  CT renal stone 07/31/2023   FINDINGS: Punctate bilateral renal calculi are present measuring up to 3 mm. Previously identified 1 cm left proximal ureteral stone is no longer seen. There are numerous new granular type calcifications in the left pelvis which may be within the distal left  ureter. Bowel-gas pattern is nonobstructive. There is a large amount of stool throughout the entire colon. Surgical coils overlie the pelvis.   IMPRESSION: 1. Previously identified 1 cm left proximal ureteral stone is no longer seen. There are numerous new granular type calcifications in the left pelvis which may be within the distal left ureter. 2. Punctate bilateral renal calculi measuring up to 3 mm. 3. Large amount of stool throughout the entire colon.     Electronically Signed   By: Darliss Cheney M.D.   On: 09/15/2023 23:50  I personally reviewed the images referenced above and note a distal left ureteral Steinstrasse.  Assessment & Plan:   1. Left ureteral stone Minimally symptomatic and UA is bland, however there is a distal left ureteral Steinstrasse on KUB.  I recommended pushing fluids and continuing Flomax with plans for recheck in 3 weeks and he agreed.  We discussed return precautions including uncontrolled pain, uncontrolled nausea/vomiting, or fevers.  Refilling oxycodone for pain control. - Urinalysis, Complete - Abdomen 1 view (KUB); Future - oxyCODONE (ROXICODONE) 5 MG immediate release tablet; Take 1-2 tablets (5-10 mg total) by mouth every 6 (six) hours as needed.  Dispense: 12 tablet; Refill: 0   Return in about 3 weeks (around 09/14/2023) for Stone f/u with UA + KUB prior.  Carman Ching, PA-C  [facility] [address] [place], [place] [address] [phone]

## 2023-09-12 NOTE — Progress Notes (Unsigned)
09/13/2023 8:59 PM   Isaac Jackson 1971/01/06 [id number]  Referring provider: No referring provider defined for this encounter.  Urological history: 1. Nephrolithiasis -recurrent stones since the age of 3 -2 ESWL -6 URS's -CT (07/2023) - 8 mm calculus in proximal left ureter -left ESWL (07/2023)   No chief complaint on file.  HPI: Brighton Ly is a 52 y.o. male who presents today for follow up.   Previous records reviewed.   He underwent left ESWL on 08/02/2023 and was found to have [place] on follow up KUB.    UA ***  KUB ***  PMH: Past Medical History:  Diagnosis Date   Asthma    History of alcohol abuse    patient reports that he is an alcoholic and goes to AA   Hypertension    Kidney stone    Renal disorder     Surgical History: Past Surgical History:  Procedure Laterality Date   ABDOMINAL SURGERY     APPENDECTOMY     EXTRACORPOREAL SHOCK WAVE LITHOTRIPSY Left 08/02/2023   Procedure: EXTRACORPOREAL SHOCK WAVE LITHOTRIPSY (ESWL);  Surgeon: Riki Altes, MD;  Location: [facility] ORS;  Service: Urology;  Laterality: Left;   HERNIA REPAIR      Home Medications:  Allergies as of 09/13/2023   No Known Allergies      Medication List        Accurate as of September 12, 2023  8:59 PM. If you have any questions, ask your nurse or doctor.          losartan 100 MG tablet Commonly known as: Cozaar Take 1 tablet (100 mg total) by mouth daily.   oxyCODONE 5 MG immediate release tablet Commonly known as: Roxicodone Take 1-2 tablets (5-10 mg total) by mouth every 6 (six) hours as needed.   tamsulosin 0.4 MG Caps capsule Commonly known as: FLOMAX Take 1 capsule (0.4 mg total) by mouth daily.        Allergies: No Known Allergies  Family History: Family History  Problem Relation Age of Onset   Diabetes Mother    Prostate cancer Father    Alcohol abuse Father     Social History:  reports that she has quit smoking. Her smoking  use included cigarettes. She has never used smokeless tobacco. She reports current alcohol use. She reports that she does not use drugs.  ROS: Pertinent ROS in HPI  Physical Exam: There were no vitals taken for this visit.  Constitutional:  Well nourished. Alert and oriented, No acute distress. HEENT: [place] AT, moist mucus membranes.  Trachea midline, no masses. Cardiovascular: No clubbing, cyanosis, or edema. Respiratory: Normal respiratory effort, no increased work of breathing. GI: Abdomen is soft, non tender, non distended, no abdominal masses. Liver and spleen not palpable.  No hernias appreciated.  Stool sample for occult testing is not indicated.   GU: No CVA tenderness.  No bladder fullness or masses.  Patient with circumcised/uncircumcised phallus. ***Foreskin easily retracted***  Urethral meatus is patent.  No penile discharge. No penile lesions or rashes. Scrotum without lesions, cysts, rashes and/or edema.  Testicles are located scrotally bilaterally. No masses are appreciated in the testicles. Left and right epididymis are normal. Rectal: Patient with  normal sphincter tone. Anus and perineum without scarring or rashes. No rectal masses are appreciated. Prostate is approximately *** grams, *** nodules are appreciated. Seminal vesicles are normal. Skin: No rashes, bruises or suspicious lesions. Lymph: No cervical or inguinal adenopathy. Neurologic: Grossly intact, no focal deficits,  moving all 4 extremities. Psychiatric: Normal mood and affect.  Laboratory Data: Lab Results  Component Value Date   WBC 6.6 09/29/2022   HGB 18.1 (H) 09/29/2022   HCT 54.8 (H) 09/29/2022   MCV 92.7 09/29/2022   PLT 176 09/29/2022    Lab Results  Component Value Date   CREATININE 1.22 09/29/2022    Lab Results  Component Value Date   AST 17 09/29/2022   Lab Results  Component Value Date   ALT 20 09/29/2022   Urinalysis See EPIC and HPI I have reviewed the labs.   Pertinent  Imaging: KUB ***, radiologist report still pending I have independently reviewed the films.    Assessment & Plan:  ***  1. Left  ureteral stone -UA *** -KUB ***  2. Left flank pain -secondary to retained fragments   No follow-ups on file.  These notes generated with voice recognition software. I apologize for typographical errors.  Cloretta Ned  [facility] [address]  [address] [place], [place] [address] [phone]

## 2023-09-13 ENCOUNTER — Other Ambulatory Visit: Payer: Self-pay

## 2023-09-13 ENCOUNTER — Ambulatory Visit
Admission: RE | Admit: 2023-09-13 | Discharge: 2023-09-13 | Disposition: A | Discharge status: Home / Self Care | Payer: 59 | Source: Ambulatory Visit | Attending: Physician Assistant | Admitting: Physician Assistant

## 2023-09-13 ENCOUNTER — Ambulatory Visit
Admission: RE | Admit: 2023-09-13 | Discharge: 2023-09-13 | Disposition: A | Discharge status: Home / Self Care | Payer: 59 | Attending: Physician Assistant | Admitting: Physician Assistant

## 2023-09-13 ENCOUNTER — Encounter: Payer: Self-pay | Admitting: Urology

## 2023-09-13 ENCOUNTER — Telehealth: Payer: Self-pay

## 2023-09-13 ENCOUNTER — Ambulatory Visit: Payer: 59 | Admitting: Urology

## 2023-09-13 VITALS — BP 118/84 | HR 111

## 2023-09-13 DIAGNOSIS — N201 Calculus of ureter: Secondary | ICD-10-CM

## 2023-09-13 DIAGNOSIS — N23 Unspecified renal colic: Secondary | ICD-10-CM | POA: Diagnosis not present

## 2023-09-13 MED ORDER — OXYCODONE HCL 5 MG PO TABS
5.0000 mg | ORAL_TABLET | Freq: Four times a day (QID) | ORAL | 0 refills | Status: DC | PRN
  Filled 2023-09-13: qty 12, 2d supply, fill #0

## 2023-09-13 MED ORDER — TAMSULOSIN HCL 0.4 MG PO CAPS
0.4000 mg | ORAL_CAPSULE | Freq: Every day | ORAL | 2 refills | Status: AC
  Filled 2023-09-13: qty 30, 30d supply, fill #0
  Filled 2023-12-22: qty 30, 30d supply, fill #1
  Filled 2024-01-25: qty 30, 30d supply, fill #2

## 2023-09-13 NOTE — Progress Notes (Signed)
   [facility] Surgical Posting Form  Surgery Date: Date: 09/25/2023  Surgeon: Dr. Irineo Axon, MD  Inpt ( No  )   Outpt (Yes)   Obs ( No  )   Diagnosis: N20.1 Left Ureteral Stone  -CPT: [phone]  Surgery: Left Ureteroscopy with Laser Lithotripsy and Stent Placement   Stop Anticoagulations: Yes and hold ASA  Cardiac/Medical/Pulmonary Clearance needed: no  *Orders entered into EPIC  Date: 09/13/23   *Case booked in [place]  Date: 09/13/23  *Notified pt of Surgery: Date: 09/13/23  PRE-OP UA & CX: yes, obtained while in clinic today   *Placed into Prior Authorization Work Que Date: 09/13/23  Assistant/laser/rep:No

## 2023-09-13 NOTE — Telephone Encounter (Signed)
  Per Dr. Lonna Cobb, Patient is to be scheduled for  Left Ureteroscopy with Laser Lithotripsy and Stent Placement   Isaac Jackson was contacted and possible surgical dates were discussed, Tuesday December 3rd, 2024 was agreed upon for surgery.     Patient was directed to call [phone] between 1-3pm the day before surgery to find out surgical arrival time.  Instructions were given not to eat or drink from midnight on the night before surgery and have a driver for the day of surgery. On the surgery day patient was instructed to enter through the Medical Mall entrance of [facility] report the Same Day Surgery desk.   Pre-Admit Testing will be in contact via phone to set up Jackson interview with the anesthesia team to review your history and medications prior to surgery.   Reminder of this information was sent via MyChart to the patient.

## 2023-09-13 NOTE — Progress Notes (Signed)
Surgical Physician Order Form [facility]  Dr. Irineo Axon, MD  * Scheduling expectation : 12/03 w/ Dr. Lonna Cobb  *Length of Case:   *Clearance needed: no  *Anticoagulation Instructions: Hold all anticoagulants  *Aspirin Instructions: Hold Aspirin  *Post-op visit Date/Instructions:  TBD  *Diagnosis: Left Ureteral Stone  *Procedure: left Ureteroscopy w/laser lithotripsy & stent placement (82956)   Additional orders: N/A  -Admit type: OUTpatient  -Anesthesia: General  -VTE Prophylaxis Standing Order SCD's       Other:   -Standing Lab Orders Per Anesthesia    Lab other: None  -Standing Test orders EKG/Chest x-ray per Anesthesia       Test other:   - Medications:  Ancef 2gm IV  -Other orders:  N/A

## 2023-09-14 LAB — URINALYSIS, COMPLETE
Bilirubin, UA: NEGATIVE
Glucose, UA: NEGATIVE
Ketones, UA: NEGATIVE
Leukocytes,UA: NEGATIVE
Nitrite, UA: NEGATIVE
Specific Gravity, UA: >1.03 — ABNORMAL HIGH (ref 1.005–1.030)
Urobilinogen, Ur: 0.2 mg/dL (ref 0.2–1.0)
pH, UA: 5.5 (ref 5.0–7.5)

## 2023-09-14 LAB — MICROSCOPIC EXAMINATION
Epithelial Cells (non renal): >10 /[HPF] — AB (ref 0–10)
RBC, Urine: >30 /[HPF] — AB (ref 0–2)

## 2023-09-18 ENCOUNTER — Inpatient Hospital Stay
Admission: RE | Admit: 2023-09-18 | Discharge: 2023-09-18 | Disposition: A | Discharge status: Home / Self Care | Payer: 59 | Source: Ambulatory Visit

## 2023-09-18 HISTORY — DX: Insomnia, unspecified: G47.00

## 2023-09-18 HISTORY — DX: Other lack of coordination: R27.8

## 2023-09-18 HISTORY — DX: Anxiety disorder, unspecified: F41.9

## 2023-09-18 HISTORY — DX: Alcohol use, unspecified with intoxication, unspecified: F10.929

## 2023-09-18 HISTORY — DX: Deficiency of other specified B group vitamins: E53.8

## 2023-09-18 LAB — CULTURE, URINE COMPREHENSIVE

## 2023-09-18 NOTE — Patient Instructions (Signed)
Your procedure is scheduled on: Tuesday 09/25/2023  Report to the Registration Desk on the 1st floor of the Medical Mall. To find out your arrival time, please call [phone] between 1PM - 3PM on: Monday 09/24/23  If your arrival time is 6:00 am, do not arrive before that time as the Medical Mall entrance doors do not open until 6:00 am.  REMEMBER: Instructions that are not followed completely may result in serious medical risk, up to and including death; or upon the discretion of your surgeon and anesthesiologist your surgery may need to be rescheduled.  Do not eat food or drink fluids after midnight the night before surgery.  No gum chewing or hard candies.  One week prior to surgery: Stop Anti-inflammatories (NSAIDS) such as Advil, Aleve, Ibuprofen, Motrin, Naproxen, Naprosyn and Aspirin based products such as Excedrin, Goody's Powder, BC Powder. Stop ANY OVER THE COUNTER supplements until after surgery.  You may however, continue to take Tylenol if needed for pain up until the day of surgery.  Continue taking all of your other prescription medications up until the day of surgery.  ON THE DAY OF SURGERY ONLY TAKE THESE MEDICATIONS WITH SIPS OF WATER:  tamsulosin (FLOMAX) 0.4 MG CAPS    No Alcohol for 24 hours before or after surgery.  No Smoking including e-cigarettes for 24 hours before surgery.  No chewable tobacco products for at least 6 hours before surgery.  No nicotine patches on the day of surgery.  Do not use any "recreational" drugs for at least a week (preferably 2 weeks) before your surgery.  Please be advised that the combination of cocaine and anesthesia may have negative outcomes, up to and including death. If you test positive for cocaine, your surgery will be cancelled.  On the morning of surgery brush your teeth with toothpaste and water, you may rinse your mouth with mouthwash if you wish. Do not swallow any toothpaste or mouthwash.  Use CHG Soap or wipes  as directed on instruction sheet.  Do not wear jewelry, make-up, hairpins, clips or nail polish.  For welded (permanent) jewelry: bracelets, anklets, waist bands, etc.  Please have this removed prior to surgery.  If it is not removed, there is a chance that hospital personnel will need to cut it off on the day of surgery.  Do not wear lotions, powders, or perfumes.   Do not shave body hair from the neck down 48 hours before surgery.  Contact lenses, hearing aids and dentures may not be worn into surgery.  Do not bring valuables to the hospital. [facility] is not responsible for any missing/lost belongings or valuables.   Total Shoulder Arthroplasty:  use Benzoyl Peroxide 5% Gel as directed on instruction sheet.  Bring your C-PAP to the hospital in case you may have to spend the night.   Notify your doctor if there is any change in your medical condition (cold, fever, infection).  Wear comfortable clothing (specific to your surgery type) to the hospital.  After surgery, you can help prevent lung complications by doing breathing exercises.  Take deep breaths and cough every 1-2 hours. Your doctor may order a device called an Incentive Spirometer to help you take deep breaths. When coughing or sneezing, hold a pillow firmly against your incision with both hands. This is called "splinting." Doing this helps protect your incision. It also decreases belly discomfort.  If you are being admitted to the hospital overnight, leave your suitcase in the car. After surgery it may  be brought to your room.  In case of increased patient census, it may be necessary for you, the patient, to continue your postoperative care in the Same Day Surgery department.  If you are being discharged the day of surgery, you will not be allowed to drive home. You will need a responsible individual to drive you home and stay with you for 24 hours after surgery.   If you are taking public transportation, you will need  to have a responsible individual with you.  Please call the Pre-admissions Testing Dept. at [phone] if you have any questions about these instructions.  Surgery Visitation Policy:  Patients having surgery or a procedure may have two visitors.  Children under the age of 47 must have an adult with them who is not the patient.  Inpatient Visitation:    Visiting hours are 7 a.m. to 8 p.m. Up to four visitors are allowed at one time in a patient room. The visitors may rotate out with other people during the day.  One visitor age 33 or older may stay with the patient overnight and must be in the room by 8 p.m.

## 2023-09-18 NOTE — Pre-Procedure Instructions (Signed)
Pre surgery phone call to Patient to do PAT interview, patient stated that he has passed the kidney stone and will not need to do the procedure anymore. Informed patient to call the surgeon's office to let them know. I have secure chat the office to Dr. Lonna Cobb and his scheduler Efraim Kaufmann to informed them of above.

## 2023-09-19 ENCOUNTER — Telehealth: Payer: Self-pay | Admitting: Urology

## 2023-09-19 NOTE — Telephone Encounter (Signed)
Patient dropped in office to notify us that he passed his kidney stone and will not need surgery that is currently scheduled for 09/25/23.

## 2023-09-19 NOTE — Telephone Encounter (Signed)
Isaac Jackson in [place] aware.   This was [facility] last case on 12/3.

## 2023-09-25 ENCOUNTER — Telehealth: Payer: Self-pay | Admitting: Physician Assistant

## 2023-09-25 ENCOUNTER — Ambulatory Visit: Admission: RE | Admit: 2023-09-25 | Payer: 59 | Source: Home / Self Care | Admitting: Urology

## 2023-09-25 ENCOUNTER — Encounter: Admission: RE | Payer: Self-pay | Source: Home / Self Care

## 2023-09-25 SURGERY — CYSTOSCOPY/URETEROSCOPY/HOLMIUM LASER/STENT PLACEMENT
Anesthesia: General | Laterality: Left

## 2023-09-25 NOTE — Telephone Encounter (Signed)
He had multiple stones, so I'd recommend we get another KUB to make sure he passed all of them and not just one, in which case he will still need surgery.

## 2023-09-26 ENCOUNTER — Other Ambulatory Visit: Payer: Self-pay

## 2023-09-26 DIAGNOSIS — N201 Calculus of ureter: Secondary | ICD-10-CM

## 2023-09-26 DIAGNOSIS — N200 Calculus of kidney: Secondary | ICD-10-CM

## 2023-09-26 NOTE — Telephone Encounter (Signed)
Pt aware KUB ordered.

## 2023-11-26 ENCOUNTER — Other Ambulatory Visit: Payer: Self-pay

## 2023-11-26 MED ORDER — LOSARTAN POTASSIUM 100 MG PO TABS
100.0000 mg | ORAL_TABLET | Freq: Every day | ORAL | 1 refills | Status: AC
  Filled 2023-11-26: qty 30, 30d supply, fill #0
  Filled 2023-12-25: qty 30, 30d supply, fill #1

## 2023-12-25 ENCOUNTER — Other Ambulatory Visit: Payer: Self-pay

## 2024-03-06 ENCOUNTER — Ambulatory Visit
Admission: EM | Admit: 2024-03-06 | Discharge: 2024-03-06 | Disposition: A | Discharge status: Home / Self Care | Attending: Emergency Medicine | Admitting: Emergency Medicine

## 2024-03-06 ENCOUNTER — Encounter: Payer: Self-pay | Admitting: *Deleted

## 2024-03-06 DIAGNOSIS — J069 Acute upper respiratory infection, unspecified: Secondary | ICD-10-CM | POA: Diagnosis not present

## 2024-03-06 LAB — POC COVID19/FLU A&B COMBO
Covid Antigen, POC: NEGATIVE
Influenza A Antigen, POC: NEGATIVE
Influenza B Antigen, POC: NEGATIVE

## 2024-03-06 MED ORDER — GUAIFENESIN-CODEINE 100-10 MG/5ML PO SOLN: 5.0000 mL | Freq: Four times a day (QID) | ORAL | 0 refills | Status: DC | PRN

## 2024-03-06 MED ORDER — AMOXICILLIN-POT CLAVULANATE 400-57 MG/5ML PO SUSR: 400.0000 mg | Freq: Two times a day (BID) | ORAL | 0 refills | Status: AC

## 2024-03-06 NOTE — Discharge Instructions (Signed)
 Your symptoms today are most likely being caused by a virus and should steadily improve in time it can take up to 7 to 10 days before you truly start to see a turnaround however things will get better, if no improvement seen by Wednesday may pick up Augmentin from the pharmacy for bacterial coverage  COVID and flu test negative  May use cough syrup every 6 hours, be mindful this will make you feel sleepy    You can take Tylenol  and/or Ibuprofen as needed for fever reduction and pain relief.   For cough: honey 1/2 to 1 teaspoon (you can dilute the honey in water or another fluid).  You can also use guaifenesin and dextromethorphan for cough. You can use a humidifier for chest congestion and cough.  If you don't have a humidifier, you can sit in the bathroom with the hot shower running.      For sore throat: try warm salt water gargles, cepacol lozenges, throat spray, warm tea or water with lemon/honey, popsicles or ice, or OTC cold relief medicine for throat discomfort.   For congestion: take a daily anti-histamine like Zyrtec, Claritin, and a oral decongestant, such as pseudoephedrine.  You can also use Flonase 1-2 sprays in each nostril daily.   It is important to stay hydrated: drink plenty of fluids (water, gatorade/powerade/pedialyte, juices, or teas) to keep your throat moisturized and help further relieve irritation/discomfort.

## 2024-03-06 NOTE — ED Triage Notes (Signed)
 Patient states cough, chills, fatigue since Sunday.

## 2024-03-06 NOTE — ED Provider Notes (Signed)
 [facility]    CSN: [id number] Arrival date & time: 03/06/24  1039      History   Chief Complaint Chief Complaint  Patient presents with   Cough   Chills    HPI Isaac Jackson is a 53 y.o. adult.   Patient presents for evaluation of a fever peaking at 101, nasal congestion, productive cough, scratchy throat and nausea without vomiting present for 4 days.  Associated increased fatigue.  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  Has attempted to increase fluids, Mucinex, Advil and Tylenol .  Tolerating food and liquids but appetite is decreased.  Denies shortness of breath or wheezing.  Past Medical History:  Diagnosis Date   Alcohol use with intoxication with complication (HCC)    Anxiety    Asthma    B12 deficiency    History of alcohol abuse    patient reports that he is an alcoholic and goes to AA   Hypertension    Insomnia, unspecified type    Kidney stone    Renal disorder    Subacute ataxia     Patient Active Problem List   Diagnosis Date Noted   Malnutrition of moderate degree 03/22/2021   Alcohol use disorder, severe, dependence (HCC) 03/19/2021   Alcohol withdrawal (HCC) 03/19/2021   Alcohol use with intoxication with complication (HCC) 03/18/2021   Bilateral nephrolithiasis 02/22/2021   Acute renal failure (ARF) (HCC) 02/22/2021   Chest pain 02/22/2021   Hyponatremia 02/22/2021   Elevated bilirubin 02/22/2021   HTN (hypertension) 02/22/2021   BPH (benign prostatic hyperplasia) 02/22/2021   Anxiety 02/22/2021   Kidney stones 02/21/2021   CVA (cerebral vascular accident) (HCC) 12/13/2019   Alcohol withdrawal seizure with delirium (HCC) 12/08/2019   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      Home Medications    Prior to Admission medications   Medication Sig Start Date End Date Taking? Authorizing Provider  amoxicillin-clavulanate (AUGMENTIN) 400-57 MG/5ML suspension Take 5 mLs (400 mg total) by mouth 2 (two) times daily for 7 days. 03/12/24 03/19/24 Yes Jahna Liebert R, NP  guaiFENesin-codeine 100-10 MG/5ML syrup Take 5 mLs by mouth every 6 (six) hours as needed for cough. 03/06/24  Yes Cedra Villalon R, NP  losartan  (COZAAR ) 100 MG tablet Take 1 tablet (100 mg total) by mouth daily. 08/10/22   Lubertha Rush, MD  losartan  (COZAAR ) 100 MG tablet Take 1 tablet (100 mg total) by mouth daily. 11/05/23   Marlon Simpson, PA  oxyCODONE  (ROXICODONE ) 5 MG immediate release tablet Take 1-2 tablets (5-10 mg total) by mouth every 6 (six) hours as needed. 09/13/23   Matilde Son A, PA-C  tamsulosin  (FLOMAX ) 0.4 MG CAPS capsule Take 1 capsule (0.4 mg total) by mouth daily. 09/13/23   Oda Bence, PA-C    Family History Family History  Problem Relation Age of Onset   Diabetes Mother    Prostate cancer Father    Alcohol abuse Father     Social History Social History   Tobacco Use   Smoking status: Former  Types: Cigarettes   Smokeless tobacco: Never  Vaping Use   Vaping status: Never Used  Substance Use Topics   Alcohol use: Not Currently    Comment: daily   Drug use: Never     Allergies   Patient has no known allergies.   Review of Systems Review of Systems   Physical Exam Triage Vital Signs ED Triage Vitals  Encounter Vitals Group     BP 03/06/24 1056 (!) 144/105     Systolic BP Percentile --      Diastolic BP Percentile --      Pulse Rate 03/06/24 1056 79     Resp 03/06/24 1056 19     Temp 03/06/24 1056 99 F (37.2 C)     Temp Source 03/06/24 1056 Oral     SpO2 03/06/24 1056 97 %     Weight 03/06/24 1057 140 lb (63.5  kg)     Height 03/06/24 1057 5\' 7"  (1.702 m)     Head Circumference --      Peak Flow --      Pain Score 03/06/24 1057 0     Pain Loc --      Pain Education --      Exclude from Growth Chart --    No data found.  Updated Vital Signs BP 132/89 (BP Location: Left Arm)   Pulse 79   Temp 98.5 F (36.9 C) (Oral)   Resp 19   Ht 5\' 7"  (1.702 m)   Wt 140 lb (63.5 kg)   SpO2 97%   BMI 21.93 kg/m   Visual Acuity Right Eye Distance:   Left Eye Distance:   Bilateral Distance:    Right Eye Near:   Left Eye Near:    Bilateral Near:     Physical Exam Constitutional:      Appearance: Normal appearance.  HENT:     Head: Normocephalic.     Right Ear: Tympanic membrane, ear canal and external ear normal.     Left Ear: Tympanic membrane, ear canal and external ear normal.     Nose: Congestion present.     Mouth/Throat:     Pharynx: Oropharynx is clear. No oropharyngeal exudate or posterior oropharyngeal erythema.  Eyes:     Extraocular Movements: Extraocular movements intact.  Cardiovascular:     Rate and Rhythm: Normal rate and regular rhythm.     Pulses: Normal pulses.     Heart sounds: Normal heart sounds.  Pulmonary:     Effort: Pulmonary effort is normal.     Breath sounds: Normal breath sounds.  Musculoskeletal:     Cervical back: Normal range of motion and neck supple.  Neurological:     Mental Status: She is alert and oriented to person, place, and time. Mental status is at baseline.      UC Treatments / Results  Labs (all labs ordered are listed, but only abnormal results are displayed) Labs Reviewed  POC COVID19/FLU A&B COMBO - Normal    EKG   Radiology No results found.  Procedures Procedures (including critical care time)  Medications Ordered in UC Medications - No data to display  Initial Impression / Assessment and Plan / UC Course  I have reviewed the triage vital signs and the nursing notes.  Pertinent labs & imaging results that were  available during my care of the patient were reviewed by me and considered in my medical decision making (see chart for details).  Viral URI with cough  Patient is  in no signs of distress nor toxic appearing.  Vital signs are stable.  Low suspicion for pneumonia, pneumothorax or bronchitis and therefore will defer imaging. Covid and Flu test negative.  Prescribed guaifenesin codeine, endorses Tessalon ineffective.  Watch and wait antibiotic placed at pharmacy. May use additional over-the-counter medications as needed for supportive care.  May follow-up with urgent care as needed if symptoms persist or worsen.  Note given.   Final Clinical Impressions(s) / UC Diagnoses   Final diagnoses:  Viral URI with cough   Discharge Instructions      Your symptoms today are most likely being caused by a virus and should steadily improve in time it can take up to 7 to 10 days before you truly start to see a turnaround however things will get better, if no improvement seen by Wednesday may pick up Augmentin from the pharmacy for bacterial coverage  COVID and flu test negative  May use cough syrup every 6 hours, be mindful this will make you feel sleepy    You can take Tylenol  and/or Ibuprofen as needed for fever reduction and pain relief.   For cough: honey 1/2 to 1 teaspoon (you can dilute the honey in water or another fluid).  You can also use guaifenesin and dextromethorphan for cough. You can use a humidifier for chest congestion and cough.  If you don't have a humidifier, you can sit in the bathroom with the hot shower running.      For sore throat: try warm salt water gargles, cepacol lozenges, throat spray, warm tea or water with lemon/honey, popsicles or ice, or OTC cold relief medicine for throat discomfort.   For congestion: take a daily anti-histamine like Zyrtec, Claritin, and a oral decongestant, such as pseudoephedrine.  You can also use Flonase 1-2 sprays in each nostril daily.   It is  important to stay hydrated: drink plenty of fluids (water, gatorade/powerade/pedialyte, juices, or teas) to keep your throat moisturized and help further relieve irritation/discomfort.   ED Prescriptions     Medication Sig Dispense Auth. Provider   guaiFENesin-codeine 100-10 MG/5ML syrup Take 5 mLs by mouth every 6 (six) hours as needed for cough. 120 mL Aisley Whan R, NP   amoxicillin-clavulanate (AUGMENTIN) 400-57 MG/5ML suspension Take 5 mLs (400 mg total) by mouth 2 (two) times daily for 7 days. 70 mL [place] Nonaka, Maybelle Spatz, NP      I have reviewed the PDMP during this encounter.   Reena Canning, NP 03/06/24 1151

## 2024-05-26 ENCOUNTER — Ambulatory Visit (INDEPENDENT_AMBULATORY_CARE_PROVIDER_SITE_OTHER): Admitting: Urology

## 2024-05-26 ENCOUNTER — Encounter: Payer: Self-pay | Admitting: Urology

## 2024-05-26 VITALS — BP 121/86 | HR 93 | Ht 67.0 in | Wt 140.0 lb

## 2024-05-26 DIAGNOSIS — R5383 Other fatigue: Secondary | ICD-10-CM | POA: Diagnosis not present

## 2024-05-26 DIAGNOSIS — R6882 Decreased libido: Secondary | ICD-10-CM | POA: Diagnosis not present

## 2024-05-26 NOTE — Progress Notes (Signed)
 05/26/2024 12:33 PM   Isaac Jackson 09-24-1971 [id number]  Referring provider: [facility] [address] [place],  [place] [address]  Chief Complaint  Patient presents with   Hypogonadism    HPI: Isaac Jackson is a 53 y.o. male followed for recurrent stone disease who made an appointment for evaluation of possible hypogonadism  2-year history of symptoms consisting of tiredness, fatigue and decreased libido Has not had a testosterone  level checked No other complaints today   PMH: Past Medical History:  Diagnosis Date   Alcohol use with intoxication with complication (HCC)    Anxiety    Asthma    B12 deficiency    History of alcohol abuse    patient reports that he is an alcoholic and goes to AA   Hypertension    Insomnia, unspecified type    Kidney stone    Renal disorder    Subacute ataxia     Surgical History: Past Surgical History:  Procedure Laterality Date   ABDOMINAL SURGERY     APPENDECTOMY     EXTRACORPOREAL SHOCK WAVE LITHOTRIPSY Left 08/02/2023   Procedure: EXTRACORPOREAL SHOCK WAVE LITHOTRIPSY (ESWL);  Surgeon: Twylla Glendia BROCKS, MD;  Location: [facility];  Service: Urology;  Laterality: Left;   HERNIA REPAIR      Home Medications:  Allergies as of 05/26/2024   No Known Allergies      Medication List        Accurate as of May 26, 2024 12:33 PM. If you have any questions, ask your nurse or doctor.          STOP taking these medications    guaiFENesin -codeine  100-10 MG/5ML syrup Stopped by: Glendia BROCKS Twylla   oxyCODONE  5 MG immediate release tablet Commonly known as: Roxicodone  Stopped by: Glendia BROCKS Twylla       TAKE these medications    losartan  100 MG tablet Commonly known as: Cozaar  Take 1 tablet (100 mg total) by mouth daily.   losartan  100 MG tablet Commonly known as: COZAAR  Take 1 tablet (100 mg total) by mouth daily.   tamsulosin  0.4 MG Caps capsule Commonly known as: FLOMAX  Take 1 capsule (0.4 mg  total) by mouth daily.        Allergies: No Known Allergies  Family History: Family History  Problem Relation Age of Onset   Diabetes Mother    Prostate cancer Father    Alcohol abuse Father     Social History:  reports that he has quit smoking. His smoking use included cigarettes. He has never used smokeless tobacco. He reports that he does not currently use alcohol. He reports that he does not use drugs.   Physical Exam: BP 121/86   Pulse 93   Ht 5' 7 (1.702 m)   Wt 140 lb (63.5 kg)   BMI 21.93 kg/m   Constitutional:  Alert and oriented, No acute distress. HEENT: [place] AT Respiratory: Normal respiratory effort, no increased work of breathing. GU: Phallus without lesions.  Testes descended bilaterally without masses or tenderness.  Estimated volume 15 cc bilaterally Psychiatric: Normal mood and affect.   Assessment & Plan:    1.  Low libido Schedule a.m. testosterone  level We discussed the diagnosis of testosterone  is based on 2 abnormal total testosterone  levels drawn in the a.m. with signs and symptoms of low testosterone . We discussed various forms of testosterone  placement including topical preparations, intramuscular injections, subcutaneous injections, subcutaneous pellet implantation and oral testosterone .  Pros and cons of each form  were discussed.  The risk of transference of topical testosterone . I had an extensive discussion regarding testosterone  replacement therapy including the following: Treatment may result in improvements in erectile function, low sex drive, anemia, bone mineral density, lean body mass, and depressive symptoms.  There is no conclusive evidence linking testosterone  therapy to the development of prostate cancer; there is no definitive evidence linking testosterone  therapy to a higher incidence of venothrombolic events; at the present time it cannot be stated definitively whether testosterone  therapy increases or decreases the risk of  cardiovascular events including myocardial infarction and stroke. Potential side effects were discussed including erythrocytosis, gynecomastia.  The need for regular monitoring of testosterone  levels and hematocrit was discussed. He will be notified with his initial testosterone  results   Glendia JAYSON Barba, MD  [facility] [address] [place], [place] [address] [phone]

## 2024-05-29 ENCOUNTER — Other Ambulatory Visit

## 2024-05-29 DIAGNOSIS — R5383 Other fatigue: Secondary | ICD-10-CM

## 2024-05-30 LAB — TESTOSTERONE: Testosterone: 720 ng/dL (ref 264–916)

## 2024-05-31 ENCOUNTER — Ambulatory Visit: Payer: Self-pay | Admitting: Urology
